# Patient Record
Sex: Male | Born: 1985
Health system: Southern US, Community
[De-identification: ages and names within clinical notes are randomized; demographics above are authoritative.]

## PROBLEM LIST (undated history)

## (undated) DIAGNOSIS — R7989 Other specified abnormal findings of blood chemistry: Secondary | ICD-10-CM

## (undated) DIAGNOSIS — I10 Essential (primary) hypertension: Secondary | ICD-10-CM

## (undated) DIAGNOSIS — J039 Acute tonsillitis, unspecified: Secondary | ICD-10-CM

## (undated) DIAGNOSIS — E785 Hyperlipidemia, unspecified: Secondary | ICD-10-CM

## (undated) HISTORY — DX: Other specified abnormal findings of blood chemistry: R79.89

## (undated) HISTORY — PX: NO PAST SURGERIES: SHX2092

## (undated) HISTORY — DX: Hyperlipidemia, unspecified: E78.5

---
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
## 2012-12-08 ENCOUNTER — Emergency Department (HOSPITAL_COMMUNITY)
Admission: EM | Admit: 2012-12-08 | Discharge: 2012-12-09 | Disposition: A | Discharge status: Home / Self Care | Payer: Self-pay | Attending: Emergency Medicine | Admitting: Emergency Medicine

## 2012-12-08 DIAGNOSIS — L0211 Cutaneous abscess of neck: Secondary | ICD-10-CM | POA: Insufficient documentation

## 2012-12-08 MED ORDER — SULFAMETHOXAZOLE-TRIMETHOPRIM 800-160 MG PO TABS: 1.0000 | ORAL_TABLET | Freq: Two times a day (BID) | ORAL | Status: DC

## 2012-12-08 MED ORDER — IBUPROFEN 800 MG PO TABS
800.0000 mg | ORAL_TABLET | Freq: Once | ORAL | Status: AC
  Administered 2012-12-08: 800 mg via ORAL
  Filled 2012-12-08: qty 1

## 2012-12-08 MED ORDER — IBUPROFEN 800 MG PO TABS: 800.0000 mg | ORAL_TABLET | Freq: Three times a day (TID) | ORAL | Status: DC

## 2012-12-08 NOTE — ED Notes (Signed)
Pt c/o abscess to R side of neck and back of head on R side. Pt states he is taking amoxicillin for this. States abscesses getting worse. Pt with no acute distress.
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
## 2012-12-09 NOTE — ED Provider Notes (Signed)
Medical screening examination/treatment/procedure(s) were performed by non-physician practitioner and as supervising physician I was immediately available for consultation/collaboration.   Janise Gora M Melody Cirrincione, DO 12/09/12 1222 

## 2012-12-18 ENCOUNTER — Emergency Department (HOSPITAL_COMMUNITY)
Admission: EM | Admit: 2012-12-18 | Discharge: 2012-12-18 | Disposition: A | Discharge status: Home / Self Care | Payer: Self-pay | Attending: Emergency Medicine | Admitting: Emergency Medicine

## 2012-12-18 ENCOUNTER — Encounter (HOSPITAL_COMMUNITY): Payer: Self-pay | Admitting: Emergency Medicine

## 2012-12-18 DIAGNOSIS — L0291 Cutaneous abscess, unspecified: Secondary | ICD-10-CM

## 2012-12-18 DIAGNOSIS — L0211 Cutaneous abscess of neck: Secondary | ICD-10-CM | POA: Insufficient documentation

## 2012-12-18 MED ORDER — SULFAMETHOXAZOLE-TMP DS 800-160 MG PO TABS
1.0000 | ORAL_TABLET | Freq: Once | ORAL | Status: AC
  Administered 2012-12-18: 1 via ORAL
  Filled 2012-12-18: qty 1

## 2012-12-18 MED ORDER — SULFAMETHOXAZOLE-TMP DS 800-160 MG PO TABS: 1.0000 | ORAL_TABLET | Freq: Two times a day (BID) | ORAL | Status: DC

## 2012-12-18 NOTE — ED Notes (Signed)
Pt was treated for 2 abscesses of the neck on 10/5, which were drained and packed. Pt was put on abx and took the full course. Pt states the abscessed areas have gone down in size, but are not completely gone. He also states he feels another abscess forming on his left cheek.

## 2012-12-18 NOTE — ED Provider Notes (Signed)
CSN: [id number]     Arrival date & time 12/18/12  1227 History   First MD Initiated Contact with Patient 12/18/12 1309     This chart was scribed for Illene Labrador, by Ladona Ridgel Day, ED scribe. This patient was seen in room WA08/WA08 and the patient's care was started at 1227.  Chief Complaint  Patient presents with  . Wound Check   The history is provided by the patient. No language interpreter was used.   HPI Comments: Daniel Kelley is a 27 y.o. male who presents to the Emergency Department complaining of x2 painful abscesses over anterior aspect of his neck which have gradually worsened since they were recently drained 5 days ago here in ED. He states finished his round of Keflex and is no longer taking antibiotics. He states it feels like there is a new abscess beginning on his anterior neck also. He denies any current drainage from any of his abscesses. He states associated pain and swelling. He denies fever, chills, difficulty breathing or swallowing, nausea, emesis, diarrhea. He has not tried any medicines or other treatments for this problem at home.   History reviewed. No pertinent past medical history. History reviewed. No pertinent past surgical history. History reviewed. No pertinent family history. History  Substance Use Topics  . Smoking status: Never Smoker   . Smokeless tobacco: Not on file  . Alcohol Use: 1.2 oz/week    2 Cans of beer per week    Review of Systems  All other systems reviewed and are negative.   A complete 10 system review of systems was obtained and all systems are negative except as noted in the HPI and PMH.   Allergies  Shrimp  Home Medications   Current Outpatient Rx  Name  Route  Sig  Dispense  Refill  . ibuprofen (ADVIL,MOTRIN) 800 MG tablet   Oral   Take 800 mg by mouth every 8 (eight) hours as needed for pain.          Triage Vitals: BP 135/69  Pulse 85  Temp(Src) 98.5 F (36.9 C) (Oral)  Resp 18  SpO2 98% Physical Exam   Nursing note and vitals reviewed. Constitutional: He is oriented to person, place, and time. He appears well-developed and well-nourished. No distress.  HENT:  Head: Normocephalic.    Eyes: Conjunctivae and EOM are normal.  Neck:    Well-healing abscess to bilateral lower mandibular areas. No fluctuance and minimal induration and approximately half a centimeter in diameter.  Cardiovascular: Normal rate.   Pulmonary/Chest: Effort normal. No stridor.  Musculoskeletal: Normal range of motion.  Neurological: He is alert and oriented to person, place, and time.  Psychiatric: He has a normal mood and affect.    ED Course  Procedures (including critical care time) DIAGNOSTIC STUDIES: Oxygen Saturation is 98% on room air, normal by my interpretation.    COORDINATION OF CARE: At 125 PM Discussed treatment plan with patient which includes bactrim, instructed him to use warm compresses at home, instructed pt to return to ED if abscesses do not improve or get worse. Patient agrees.   Labs Review Labs Reviewed - No data to display Imaging Review No results found.  EKG Interpretation   None       MDM   1. Abscess      Filed Vitals:   12/18/12 1244 12/18/12 1340  BP: 135/69 120/70  Pulse: 85 78  Temp: 98.5 F (36.9 C) 98 F (36.7 C)  TempSrc: Oral Oral  Resp:  18 12  SpO2: 98% 97%     Fines Clos is a 27 y.o. male for wound check 2 abscess in lower mandibular area. Patient also has these abscess forming to left zygomatic area. Not fluctuant, not appropriate for incision and drainage at this time. I have advised him to start Bactrim and apply warm compresses return precautions discussed for maturing of abscess into fluctuance.  Medications  sulfamethoxazole-trimethoprim (BACTRIM DS) 800-160 MG per tablet 1 tablet (1 tablet Oral Given 12/18/12 1350)    Pt is hemodynamically stable, appropriate for, and amenable to discharge at this time. Pt verbalized understanding and  agrees with care plan. All questions answered. Outpatient follow-up and specific return precautions discussed.    Discharge Medication List as of 12/18/2012  1:40 PM    START taking these medications   Details  sulfamethoxazole-trimethoprim (BACTRIM DS) 800-160 MG per tablet Take 1 tablet by mouth 2 (two) times daily., Starting 12/18/2012, Until Discontinued, Print        I personally performed the services described in this documentation, which was scribed in my presence. The recorded information has been reviewed and is accurate.  Note: Portions of this report may have been transcribed using voice recognition software. Every effort was made to ensure accuracy; however, inadvertent computerized transcription errors may be present       Wynetta Emery, PA-C 12/19/12 1636

## 2012-12-18 NOTE — ED Notes (Signed)
Pt escorted to discharge window. Pt verbalized understanding discharge instructions. In no acute distress.  

## 2012-12-18 NOTE — ED Notes (Signed)
Pt alert and oriented x4. Respirations even and unlabored, bilateral symmetrical rise and fall of chest. Skin warm and dry. In no acute distress. Denies needs.   

## 2012-12-20 NOTE — ED Provider Notes (Signed)
Medical screening examination/treatment/procedure(s) were performed by non-physician practitioner and as supervising physician I was immediately available for consultation/collaboration.    Sinia Antosh R Kesa Birky, MD 12/20/12 1134 

## 2014-05-25 ENCOUNTER — Encounter (HOSPITAL_BASED_OUTPATIENT_CLINIC_OR_DEPARTMENT_OTHER): Payer: Self-pay

## 2014-05-25 ENCOUNTER — Emergency Department (HOSPITAL_BASED_OUTPATIENT_CLINIC_OR_DEPARTMENT_OTHER)
Admission: EM | Admit: 2014-05-25 | Discharge: 2014-05-25 | Disposition: A | Discharge status: Home / Self Care | Payer: No Typology Code available for payment source | Attending: Emergency Medicine | Admitting: Emergency Medicine

## 2014-05-25 ENCOUNTER — Emergency Department (HOSPITAL_BASED_OUTPATIENT_CLINIC_OR_DEPARTMENT_OTHER): Payer: No Typology Code available for payment source

## 2014-05-25 DIAGNOSIS — S39012D Strain of muscle, fascia and tendon of lower back, subsequent encounter: Secondary | ICD-10-CM

## 2014-05-25 DIAGNOSIS — M545 Low back pain, unspecified: Secondary | ICD-10-CM

## 2014-05-25 DIAGNOSIS — M533 Sacrococcygeal disorders, not elsewhere classified: Secondary | ICD-10-CM

## 2014-05-25 MED ORDER — IBUPROFEN 800 MG PO TABS: 800.0000 mg | ORAL_TABLET | Freq: Three times a day (TID) | ORAL | Status: DC

## 2014-05-25 MED ORDER — METHOCARBAMOL 500 MG PO TABS: 1000.0000 mg | ORAL_TABLET | Freq: Four times a day (QID) | ORAL | Status: DC | PRN

## 2014-05-25 NOTE — Discharge Instructions (Signed)
Sciatica °Sciatica is pain, weakness, numbness, or tingling along the path of the sciatic nerve. The nerve starts in the lower back and runs down the back of each leg. The nerve controls the muscles in the lower leg and in the back of the knee, while also providing sensation to the back of the thigh, lower leg, and the sole of your foot. Sciatica is a symptom of another medical condition. For instance, nerve damage or certain conditions, such as a herniated disk or bone spur on the spine, pinch or put pressure on the sciatic nerve. This causes the pain, weakness, or other sensations normally associated with sciatica. Generally, sciatica only affects one side of the body. °CAUSES  °· Herniated or slipped disc. °· Degenerative disk disease. °· A pain disorder involving the narrow muscle in the buttocks (piriformis syndrome). °· Pelvic injury or fracture. °· Pregnancy. °· Tumor (rare). °SYMPTOMS  °Symptoms can vary from mild to very severe. The symptoms usually travel from the low back to the buttocks and down the back of the leg. Symptoms can include: °· Mild tingling or dull aches in the lower back, leg, or hip. °· Numbness in the back of the calf or sole of the foot. °· Burning sensations in the lower back, leg, or hip. °· Sharp pains in the lower back, leg, or hip. °· Leg weakness. °· Severe back pain inhibiting movement. °These symptoms may get worse with coughing, sneezing, laughing, or prolonged sitting or standing. Also, being overweight may worsen symptoms. °DIAGNOSIS  °Your caregiver will perform a physical exam to look for common symptoms of sciatica. He or she may ask you to do certain movements or activities that would trigger sciatic nerve pain. Other tests may be performed to find the cause of the sciatica. These may include: °· Blood tests. °· X-rays. °· Imaging tests, such as an MRI or CT scan. °TREATMENT  °Treatment is directed at the cause of the sciatic pain. Sometimes, treatment is not necessary  and the pain and discomfort goes away on its own. If treatment is needed, your caregiver may suggest: °· Over-the-counter medicines to relieve pain. °· Prescription medicines, such as anti-inflammatory medicine, muscle relaxants, or narcotics. °· Applying heat or ice to the painful area. °· Steroid injections to lessen pain, irritation, and inflammation around the nerve. °· Reducing activity during periods of pain. °· Exercising and stretching to strengthen your abdomen and improve flexibility of your spine. Your caregiver may suggest losing weight if the extra weight makes the back pain worse. °· Physical therapy. °· Surgery to eliminate what is pressing or pinching the nerve, such as a bone spur or part of a herniated disk. °HOME CARE INSTRUCTIONS  °· Only take over-the-counter or prescription medicines for pain or discomfort as directed by your caregiver. °· Apply ice to the affected area for 20 minutes, 3-4 times a day for the first 48-72 hours. Then try heat in the same way. °· Exercise, stretch, or perform your usual activities if these do not aggravate your pain. °· Attend physical therapy sessions as directed by your caregiver. °· Keep all follow-up appointments as directed by your caregiver. °· Do not wear high heels or shoes that do not provide proper support. °· Check your mattress to see if it is too soft. A firm mattress may lessen your pain and discomfort. °SEEK IMMEDIATE MEDICAL CARE IF:  °· You lose control of your bowel or bladder (incontinence). °· You have increasing weakness in the lower back, pelvis, buttocks,   or legs. °· You have redness or swelling of your back. °· You have a burning sensation when you urinate. °· You have pain that gets worse when you lie down or awakens you at night. °· Your pain is worse than you have experienced in the past. °· Your pain is lasting longer than 4 weeks. °· You are suddenly losing weight without reason. °MAKE SURE YOU: °· Understand these  instructions. °· Will watch your condition. °· Will get help right away if you are not doing well or get worse. °Document Released: 02/14/2001 Document Revised: 08/22/2011 Document Reviewed: 07/02/2011 °ExitCare® Patient Information ©2015 [employer]. This information is not intended to replace advice given to you by your health care provider. Make sure you discuss any questions you have with your health care provider. ° ° °Back Exercises °Back exercises help treat and prevent back injuries. The goal of back exercises is to increase the strength of your abdominal and back muscles and the flexibility of your back. These exercises should be started when you no longer have back pain. Back exercises include: °· Pelvic Tilt. Lie on your back with your knees bent. Tilt your pelvis until the lower part of your back is against the floor. Hold this position 5 to 10 sec and repeat 5 to 10 times. °· Knee to Chest. Pull first 1 knee up against your chest and hold for 20 to 30 seconds, repeat this with the other knee, and then both knees. This may be done with the other leg straight or bent, whichever feels better. °· Sit-Ups or Curl-Ups. Bend your knees 90 degrees. Start with tilting your pelvis, and do a partial, slow sit-up, lifting your trunk only 30 to 45 degrees off the floor. Take at least 2 to 3 seconds for each sit-up. Do not do sit-ups with your knees out straight. If partial sit-ups are difficult, simply do the above but with only tightening your abdominal muscles and holding it as directed. °· Hip-Lift. Lie on your back with your knees flexed 90 degrees. Push down with your feet and shoulders as you raise your hips a couple inches off the floor; hold for 10 seconds, repeat 5 to 10 times. °· Back arches. Lie on your stomach, propping yourself up on bent elbows. Slowly press on your hands, causing an arch in your low back. Repeat 3 to 5 times. Any initial stiffness and discomfort should lessen with repetition over  time. °· Shoulder-Lifts. Lie face down with arms beside your body. Keep hips and torso pressed to floor as you slowly lift your head and shoulders off the floor. °Do not overdo your exercises, especially in the beginning. Exercises may cause you some mild back discomfort which lasts for a few minutes; however, if the pain is more severe, or lasts for more than 15 minutes, do not continue exercises until you see your caregiver. Improvement with exercise therapy for back problems is slow.  °See your caregivers for assistance with developing a proper back exercise program. °Document Released: 03/30/2004 Document Revised: 05/15/2011 Document Reviewed: 12/22/2010 °ExitCare® Patient Information ©2015 [employer]. This information is not intended to replace advice given to you by your health care provider. Make sure you discuss any questions you have with your health care provider. ° ° °Emergency Department Resource Guide °1) Find a Doctor and Pay Out of Pocket °Although you won't have to find out who is covered by your insurance plan, it is a good idea to ask around and get recommendations. You   will then need to call the office and see if the doctor you have chosen will accept you as a new patient and what types of options they offer for patients who are self-pay. Some doctors offer discounts or will set up payment plans for their patients who do not have insurance, but you will need to ask so you aren't surprised when you get to your appointment. ° °2) Contact Your Local Health Department °Not all health departments have doctors that can see patients for sick visits, but many do, so it is worth a call to see if yours does. If you don't know where your local health department is, you can check in your phone book. The CDC also has a tool to help you locate your state's health department, and many state websites also have listings of all of their local health departments. ° °3) Find a Walk-in Clinic °If your illness is  not likely to be very severe or complicated, you may want to try a walk in clinic. These are popping up all over the country in pharmacies, drugstores, and shopping centers. They're usually staffed by nurse practitioners or physician assistants that have been trained to treat common illnesses and complaints. They're usually fairly quick and inexpensive. However, if you have serious medical issues or chronic medical problems, these are probably not your best option. ° °No Primary Care Doctor: °- Call [facility] at  832-8000 - they can help you locate a primary care doctor that  accepts your insurance, provides certain services, etc. °- Physician Referral Service- 1-800-533-3463 ° °Chronic Pain Problems: °Organization         Address  Phone   Notes  °[facility]  (336) 297-2271 Patients need to be referred by their primary care doctor.  ° °Medication Assistance: °Organization         Address  Phone   Notes  °[place] [facility] [address] °[place], [place] [address] (336) 641-8030 --Must be a resident of [place] °-- Must have NO insurance coverage whatsoever (no Medicaid/ Medicare, etc.) °-- The pt. MUST have a primary care doctor that directs their care regularly and follows them in the community °  °[facility]  (866) 331-1348   °[employer]  (888) 892-1162   ° °Agencies that provide inexpensive medical care: °Organization         Address  Phone   Notes  °[facility]  (336) 832-8035   °[facility]    (336) 832-7272   °[facility] [address] °[place], [place] [address] (336) 832-4777   °[facility] [address], °[place] (336) 271-4999   °[facility]    (336) 373-0678   °[facility]    (336) 272-1050   °[facility] ° [address], [place] Phone:  (336) 832-4444, Fax:  (336) 832-4440 Hours of Operation:  9 am - 6  pm, M-F.  Also accepts Medicaid/Medicare and self-pay.  °[facility] ° [address], [place] Phone: (336) 832-3150, Fax: (336) [phone]. Hours of Operation:  8:30 am - 5:30 pm, M-F.  Also accepts Medicaid and self-pay.  °[facility] [place] [address], [place] Phone: (336) 878-6027   °[facility] [address], [place],  (336)723-1848, Ext. 123 Mondays & Thursdays: 7-9 AM.  First 15 patients are seen on a first come, first   serve basis. °  ° °Medicaid-accepting [place] Providers: ° °Organization         Address  Phone   Notes  °[facility] [address], [place] (336) 641-2100 Also accepts self-pay patients.  °[facility] [address], [place] ° (336) 856-9996   °[facility] [address], [place] (336) 288-8857   °[facility] [address] [place] [address], [place] (336) 299-7000   °Veita Bland [address], [place]  ° (336) 373-1557 Only accepts [place] Access Medicaid patients after they have their name applied to their card.  ° °Self-Pay (no insurance) in [place]: ° °Organization         Address  Phone   Notes  °Sickle Cell Patients, [facility] [address], [place] (336) 832-1970   °[facility] [address], [place] (336) 832-4400   °[facility] [place] ° [address], [place] (336) 992-4800   °[facility]/Dr. Osei-Bonsu ° [address] [place] [address], [place] or [address], [place] (336) [phone] Phone number for both [place] and [place] locations is the same.  °[facility] [address], [place] (336) 299-0000   °[facility] [address] [place] [address], [place] or [address] (336) 852-7530 °(336) 878-2260   °[facility] [address], [place] (336) 350-1642, phone; (336) 294-5005, fax Sees patients 1st and 3rd Saturday of every month.  Must not qualify for public or private insurance (i.e. Medicaid, Medicare, [place] Health Choice, Veterans' Benefits) • Household income should be no more than 200% of the poverty level •The clinic cannot treat you if you are pregnant or think you are pregnant • Sexually transmitted diseases are not treated at the clinic.  ° ° °Dental Care: °Organization         Address  Phone  Notes  °[facility] [address], [place] (336) 641-6152 Accepts children up to age 21 who are enrolled in Medicaid or [place] Health Choice; pregnant women with a Medicaid card; and children who have applied for Medicaid or [place] Health Choice, but were declined, whose parents can pay a reduced fee at time of service.  °[facility] [place]  [address], [place] (336) 641-7733 Accepts children up to age 21 who are enrolled in Medicaid or [place] Health Choice; pregnant women with a Medicaid card; and children who have applied for Medicaid or [place] Health Choice, but were declined, whose parents can pay a reduced fee at time of service.  °[facility] ° [address], [place] (336) 641-4533 Patients are seen by appointment only. Walk-ins are not accepted. [facility] will see patients 18 years of age and older. °Monday - Tuesday (8am-5pm) °Most Wednesdays (8:30-5pm) °$30 per visit, cash only  °[facility] ° [address], [place] (336) 641-4533 Patients are seen by appointment only. Walk-ins are not accepted. [facility] will see patients 18 years of age and older. °One Wednesday Evening (Monthly: Volunteer Based).  $30 per visit, cash only  °[facility]  (919) [phone] for adults; Children under age 4, call Graduate Pediatric Dentistry at ([phone]  [phone]. Children aged 4-14, please call (  [phone]) [phone] to request a pediatric application. ° Dental services are provided in all areas of dental care including fillings, crowns and bridges, complete and partial dentures, implants, gum treatment, root canals, and extractions. Preventive care is also provided. Treatment is provided to both adults and children. °Patients are selected via a lottery and there is often a waiting list. °  °[facility] [address], °[place] ° (336) 763-8833 [email or web address] °  °[facility] [address], [place], [place] (336)723-1848, Ext. 123 Second and Fourth Thursday of each month, opens at 6:30 AM; Clinic ends at 9 AM.  Patients are seen on a first-come first-served basis, and a limited number are seen during each clinic.  ° °[facility] ° [address], [place], [place] (336) 723-7904   Eligibility Requirements °You must have lived in [place], or [place] counties for at least the last three months. °  You cannot be eligible for state or federal sponsored healthcare insurance, including Veterans Administration, Medicaid, or Medicare. °  You generally cannot be eligible for healthcare insurance through your employer.  °  How to apply: °Eligibility screenings are held every Tuesday and Wednesday afternoon from 1:00 pm until 4:00 pm. You do not need an appointment for the interview!  °[facility] [address], [place], [place] 336-631-2330   °[facility]  336-342-8273   °[facility]  336-703-3100   °[facility]  336-570-6415   ° °Behavioral Health Resources in the Community: °Intensive Outpatient Programs °Organization         Address  Phone  Notes  °[facility] [address], [place], [place] 336-878-6098   °[facility] [address], [place], [place] 336-832-9800   °[facility]  [address], [place], [place] ° 336-882-2125   °[facility] [address],  °[place], [place] 1-800-853-5163 or 336-641-4981   °Substance Abuse Resources °Organization         Address  Phone  Notes  °[facility]  336-882-2125   °[facility]  336-784-9470   °[facility]  336-285-9073   °[facility]  336-845-3988   °Residential & Outpatient Substance Abuse Program  1-800-659-3381   °Psychological Services °Organization         Address  Phone  Notes  °[facility]  336- 832-9600   °[facility]  336- 378-7881   °[facility] [address], [place] 1-800-853-5163 or 336-641-4981   ° °Mobile Crisis Teams °Organization         Address  Phone  Notes  °[facility], Mobile Crisis Care Unit  1-877-626-1772   °Assertive °[facility] ° [address] [place], [place] 336-834-9664   °Sharon DeEsch [address] °[place] 336-554-5454   ° °Self-Help/Support Groups °Organization         Address  Phone             Notes  °[facility] - variety of support groups  336- 373-1402 Call for more information  °Narcotics Anonymous (NA), [facility] [address], °[place]  2 meetings at this location  ° °Residential Treatment Programs °Organization         Address  Phone  Notes  °[facility] [address],    °[place]  1-866-801-8205   °[facility] °   [address], [place], [place] 704-293-8524   °[facility] [address], [place] 336-845-3988 Admissions: 8am-3pm M-F  °[facility] [address]    °[place], [place] 336-841-1104   °[facility] [address], [place], [place] 336-379-7146   °[facility] [address]  °[place], [place] 336-285-9073   °[facility] - Intensive Outpatient [address], [place], [place]  336-852-3033   °[facility] [address]  °[place], [place] 1-877-615-2722 or 336-784-9470   °[facility] [address] [place], [place] 336-227-7417 Accepts Medicaid  °[facility] [address]  °[place] 1-800-659-3381 Substance Abuse/Addiction Treatment  ° °[place] Behavioral Health Resources °Organization         Address  Phone  Notes  °[facility]  (888) 581-9988   °Julie Brannon, PhD [address] [place], [place]   (336) 349-5553 or (336) 951-0000   °[facility]   [address] °[place], [place] (336) 349-4454   °[facility] [address], [place], [place] (336) 342-8316 Insurance/Medicaid/sponsorship through [facility]  °[facility] [address]                                    [place], [place] (336) 342-8316 Therapy/tele-psych/case  °[facility] [address]  ° [place], [place] (336) 349-2233    °Dr. Arfeen  (336) 349-4544   °[facility] [place]  [employer] [place] [facility] 1) [address], [place] °2) [address], [place] °3)  [address], [place] (336) 349-3220 °(336) 342-7768 ° °(336) 342-8140   °[place] [facility] (336) 342-1394 or (336) 342-3537 (After Hours)    ° ° ° °
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
## 2014-05-25 NOTE — ED Notes (Signed)
PA at bedside.
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
## 2014-10-03 ENCOUNTER — Emergency Department (HOSPITAL_BASED_OUTPATIENT_CLINIC_OR_DEPARTMENT_OTHER)
Admission: EM | Admit: 2014-10-03 | Discharge: 2014-10-03 | Disposition: A | Discharge status: Home / Self Care | Payer: 59 | Attending: Emergency Medicine | Admitting: Emergency Medicine

## 2014-10-03 ENCOUNTER — Encounter (HOSPITAL_BASED_OUTPATIENT_CLINIC_OR_DEPARTMENT_OTHER): Payer: Self-pay | Admitting: *Deleted

## 2014-10-03 DIAGNOSIS — H6091 Unspecified otitis externa, right ear: Secondary | ICD-10-CM | POA: Diagnosis not present

## 2014-10-03 DIAGNOSIS — H9201 Otalgia, right ear: Secondary | ICD-10-CM | POA: Diagnosis present

## 2014-10-03 MED ORDER — OXYCODONE-ACETAMINOPHEN 5-325 MG PO TABS
1.0000 | ORAL_TABLET | Freq: Once | ORAL | Status: AC
  Administered 2014-10-03: 1 via ORAL
  Filled 2014-10-03: qty 1

## 2014-10-03 MED ORDER — NEOMYCIN-POLYMYXIN-HC 3.5-10000-1 OT SUSP: 3.0000 [drp] | Freq: Three times a day (TID) | OTIC | Status: DC

## 2014-10-03 MED ORDER — CEPHALEXIN 250 MG PO CAPS
500.0000 mg | ORAL_CAPSULE | Freq: Once | ORAL | Status: AC
Start: 2014-10-03 — End: 2014-10-03
  Administered 2014-10-03: 500 mg via ORAL
  Filled 2014-10-03: qty 2

## 2014-10-03 MED ORDER — NEOMYCIN-COLIST-HC-THONZONIUM 3.3-3-10-0.5 MG/ML OT SUSP
3.0000 [drp] | Freq: Once | OTIC | Status: AC
  Administered 2014-10-03: 3 [drp] via OTIC
  Filled 2014-10-03: qty 5

## 2014-10-03 MED ORDER — OXYCODONE-ACETAMINOPHEN 5-325 MG PO TABS: 1.0000 | ORAL_TABLET | ORAL | Status: DC | PRN

## 2014-10-03 MED ORDER — CEPHALEXIN 500 MG PO CAPS: 500.0000 mg | ORAL_CAPSULE | Freq: Four times a day (QID) | ORAL | Status: DC

## 2014-10-03 NOTE — ED Notes (Signed)
Pt reports that he thought that he had a 'black head' in his ear.  States that he attempted to 'pop' it and is now having ear pain in his right ear.

## 2014-10-03 NOTE — Discharge Instructions (Signed)
Otitis Externa Otitis externa is a bacterial or fungal infection of the outer ear canal. This is the area from the eardrum to the outside of the ear. Otitis externa is sometimes called "swimmer's ear." CAUSES  Possible causes of infection include:  Swimming in dirty water.  Moisture remaining in the ear after swimming or bathing.  Mild injury (trauma) to the ear.  Objects stuck in the ear (foreign body).  Cuts or scrapes (abrasions) on the outside of the ear. SIGNS AND SYMPTOMS  The first symptom of infection is often itching in the ear canal. Later signs and symptoms may include swelling and redness of the ear canal, ear pain, and yellowish-white fluid (pus) coming from the ear. The ear pain may be worse when pulling on the earlobe. DIAGNOSIS  Your health care provider will perform a physical exam. A sample of fluid may be taken from the ear and examined for bacteria or fungi. TREATMENT  Antibiotic ear drops are often given for 10 to 14 days. Treatment may also include pain medicine or corticosteroids to reduce itching and swelling. HOME CARE INSTRUCTIONS   Apply antibiotic ear drops to the ear canal as prescribed by your health care provider.  Take medicines only as directed by your health care provider.  If you have diabetes, follow any additional treatment instructions from your health care provider.  Keep all follow-up visits as directed by your health care provider. PREVENTION   Keep your ear dry. Use the corner of a towel to absorb water out of the ear canal after swimming or bathing.  Avoid scratching or putting objects inside your ear. This can damage the ear canal or remove the protective wax that lines the canal. This makes it easier for bacteria and fungi to grow.  Avoid swimming in lakes, polluted water, or poorly chlorinated pools.  You may use ear drops made of rubbing alcohol and vinegar after swimming. Combine equal parts of white vinegar and alcohol in a bottle.  Put 3 or 4 drops into each ear after swimming. SEEK MEDICAL CARE IF:   You have a fever.  Your ear is still red, swollen, painful, or draining pus after 3 days.  Your redness, swelling, or pain gets worse.  You have a severe headache.  You have redness, swelling, pain, or tenderness in the area behind your ear. MAKE SURE YOU:   Understand these instructions.  Will watch your condition.  Will get help right away if you are not doing well or get worse. Document Released: 02/20/2005 Document Revised: 07/07/2013 Document Reviewed: 03/09/2011 [facility] Patient Information 2015 [place], [place]. This information is not intended to replace advice given to you by your health care provider. Make sure you discuss any questions you have with your health care provider.  Cephalexin tablets or capsules What is this medicine? CEPHALEXIN (sef a LEX in) is a cephalosporin antibiotic. It is used to treat certain kinds of bacterial infections It will not work for colds, flu, or other viral infections. This medicine may be used for other purposes; ask your health care provider or pharmacist if you have questions. COMMON BRAND NAME(S): Biocef, Keflex, Keftab What should I tell my health care provider before I take this medicine? They need to know if you have any of these conditions: -kidney disease -stomach or intestine problems, especially colitis -an unusual or allergic reaction to cephalexin, other cephalosporins, penicillins, other antibiotics, medicines, foods, dyes or preservatives -pregnant or trying to get pregnant -breast-feeding How should I use this medicine?  Take this medicine by mouth with a full glass of water. Follow the directions on the prescription label. This medicine can be taken with or without food. Take your medicine at regular intervals. Do not take your medicine more often than directed. Take all of your medicine as directed even if you think you are better. Do not skip doses or  stop your medicine early. Talk to your pediatrician regarding the use of this medicine in children. While this drug may be prescribed for selected conditions, precautions do apply. Overdosage: If you think you have taken too much of this medicine contact a poison control center or emergency room at once. NOTE: This medicine is only for you. Do not share this medicine with others. What if I miss a dose? If you miss a dose, take it as soon as you can. If it is almost time for your next dose, take only that dose. Do not take double or extra doses. There should be at least 4 to 6 hours between doses. What may interact with this medicine? -probenecid -some other antibiotics This list may not describe all possible interactions. Give your health care provider a list of all the medicines, herbs, non-prescription drugs, or dietary supplements you use. Also tell them if you smoke, drink alcohol, or use illegal drugs. Some items may interact with your medicine. What should I watch for while using this medicine? Tell your doctor or health care professional if your symptoms do not begin to improve in a few days. Do not treat diarrhea with over the counter products. Contact your doctor if you have diarrhea that lasts more than 2 days or if it is severe and watery. If you have diabetes, you may get a false-positive result for sugar in your urine. Check with your doctor or health care professional. What side effects may I notice from receiving this medicine? Side effects that you should report to your doctor or health care professional as soon as possible: -allergic reactions like skin rash, itching or hives, swelling of the face, lips, or tongue -breathing problems -pain or trouble passing urine -redness, blistering, peeling or loosening of the skin, including inside the mouth -severe or watery diarrhea -unusually weak or tired -yellowing of the eyes, skin Side effects that usually do not require medical  attention (report to your doctor or health care professional if they continue or are bothersome): -gas or heartburn -genital or anal irritation -headache -joint or muscle pain -nausea, vomiting This list may not describe all possible side effects. Call your doctor for medical advice about side effects. You may report side effects to FDA at 1-800-FDA-1088. Where should I keep my medicine? Keep out of the reach of children. Store at room temperature between 59 and 86 degrees F (15 and 30 degrees C). Throw away any unused medicine after the expiration date. NOTE: This sheet is a summary. It may not cover all possible information. If you have questions about this medicine, talk to your doctor, pharmacist, or health care provider.  2015, Elsevier/Gold Standard. (2007-05-27 17:09:13)  Hydrocortisone; Neomycin; Polymyxin B ear suspension What is this medicine? HYDROCORTISONE; NEOMYCIN; and POLYMYXIN B (hye droe KOR ti sone; nee oh MYE sin; pol i MIX in B) is used to treat ear infections. This medicine may be used for other purposes; ask your health care provider or pharmacist if you have questions. COMMON BRAND NAME(S): AK-Spore HC, AK-Spore HC Otic, Antibiotic Otic, Aural, Cortisporin, Cortomycin, Duomycin-HC, Oti-Sone, Oticin HC, Otimar, Pediotic, Uad What should I  tell my health care provider before I take this medicine? They need to know if you have any of these conditions: -any other active infections -chronic ear infections or fluid in the ear -perforated ear drum -an unusual or allergic reaction to hydrocortisone, neomycin, polymyxin B, sulfites, other medicines, foods, dyes, or preservatives -pregnant or trying to get pregnant -breast-feeding How should I use this medicine? This medicine is only for use in the ears. Wash your hands with soap and water. Clean your ear of any fluid that can be easily removed. Do not insert any object or swab into the ear canal. Gently warm the bottle by  holding it in the hand for 1 to 2 minutes. Lie down on your side with the infected ear up. Try not to touch the tip of the dropper to your ear, fingertips, or other surface. Shake the bottle immediately before using. Squeeze the bottle gently to put the prescribed number of drops in the ear canal. Stay in this position for 30 to 60 seconds to help the drops soak into the ear. Repeat the steps for the other ear if both ears are infected. Do not use your medicine more often than directed. Finish the full course of medicine prescribed by your doctor or health care professional even if you think your condition is better. Talk to your pediatrician regarding the use of this medicine in children. While this drug may be prescribed for selected conditions, precautions do apply. Overdosage: If you think you have taken too much of this medicine contact a poison control center or emergency room at once. NOTE: This medicine is only for you. Do not share this medicine with others. What if I miss a dose? If you miss a dose, use it as soon as you can. If it is almost time for your next dose, use only that dose. Do not take double or extra doses. What may interact with this medicine? Interactions are not expected. Do not use other ear products without talking to your doctor or health care professional. This list may not describe all possible interactions. Give your health care provider a list of all the medicines, herbs, non-prescription drugs, or dietary supplements you use. Also tell them if you smoke, drink alcohol, or use illegal drugs. Some items may interact with your medicine. What should I watch for while using this medicine? Tell your doctor or health care professional if your ear infection does not get better in a few days. Do not use longer than 10 days unless instructed by your doctor or health care professional. If rash or allergic reaction occurs, stop the product immediately and contact your physician. It  is important that you keep the infected ear(s) clean and dry. When bathing, try not to get the infected ear(s) wet. Do not go swimming unless your doctor or health care professional has told you otherwise. To prevent the spread of infection, do not share ear products, or share towels and washcloths with anyone else. What side effects may I notice from receiving this medicine? Side effects that you should report to your doctor or health care professional as soon as possible: -rash -red, itchy, dry scaly skin at the affected site -worsening ear pain Side effects that usually do not require medical attention (report to your doctor or health care professional if they continue or are bothersome): -abnormal sensation in the ear -burning or stinging while putting the drops in the ear This list may not describe all possible side effects. Call your  doctor for medical advice about side effects. You may report side effects to FDA at 1-800-FDA-1088. Where should I keep my medicine? Keep out of the reach of children. Store at room temperature between 15 and 25 degrees C (59 and 77 degrees F). Do not freeze. Throw away any unused medicine after the expiration date. NOTE: This sheet is a summary. It may not cover all possible information. If you have questions about this medicine, talk to your doctor, pharmacist, or health care provider.  2015, Elsevier/Gold Standard. (2007-07-05 15:49:00)  Acetaminophen; Oxycodone tablets What is this medicine? ACETAMINOPHEN; OXYCODONE (a set a MEE noe fen; ox i KOE done) is a pain reliever. It is used to treat mild to moderate pain. This medicine may be used for other purposes; ask your health care provider or pharmacist if you have questions. COMMON BRAND NAME(S): Endocet, Magnacet, Narvox, Percocet, Perloxx, Primalev, Primlev, Roxicet, Xolox What should I tell my health care provider before I take this medicine? They need to know if you have any of these  conditions: -brain tumor -Crohn's disease, inflammatory bowel disease, or ulcerative colitis -drug abuse or addiction -head injury -heart or circulation problems -if you often drink alcohol -kidney disease or problems going to the bathroom -liver disease -lung disease, asthma, or breathing problems -an unusual or allergic reaction to acetaminophen, oxycodone, other opioid analgesics, other medicines, foods, dyes, or preservatives -pregnant or trying to get pregnant -breast-feeding How should I use this medicine? Take this medicine by mouth with a full glass of water. Follow the directions on the prescription label. Take your medicine at regular intervals. Do not take your medicine more often than directed. Talk to your pediatrician regarding the use of this medicine in children. Special care may be needed. Patients over 81 years old may have a stronger reaction and need a smaller dose. Overdosage: If you think you have taken too much of this medicine contact a poison control center or emergency room at once. NOTE: This medicine is only for you. Do not share this medicine with others. What if I miss a dose? If you miss a dose, take it as soon as you can. If it is almost time for your next dose, take only that dose. Do not take double or extra doses. What may interact with this medicine? -alcohol -antihistamines -barbiturates like amobarbital, butalbital, butabarbital, methohexital, pentobarbital, phenobarbital, thiopental, and secobarbital -benztropine -drugs for bladder problems like solifenacin, trospium, oxybutynin, tolterodine, hyoscyamine, and methscopolamine -drugs for breathing problems like ipratropium and tiotropium -drugs for certain stomach or intestine problems like propantheline, homatropine methylbromide, glycopyrrolate, atropine, belladonna, and dicyclomine -general anesthetics like etomidate, ketamine, nitrous oxide, propofol, desflurane, enflurane, halothane, isoflurane,  and sevoflurane -medicines for depression, anxiety, or psychotic disturbances -medicines for sleep -muscle relaxants -naltrexone -narcotic medicines (opiates) for pain -phenothiazines like perphenazine, thioridazine, chlorpromazine, mesoridazine, fluphenazine, prochlorperazine, promazine, and trifluoperazine -scopolamine -tramadol -trihexyphenidyl This list may not describe all possible interactions. Give your health care provider a list of all the medicines, herbs, non-prescription drugs, or dietary supplements you use. Also tell them if you smoke, drink alcohol, or use illegal drugs. Some items may interact with your medicine. What should I watch for while using this medicine? Tell your doctor or health care professional if your pain does not go away, if it gets worse, or if you have new or a different type of pain. You may develop tolerance to the medicine. Tolerance means that you will need a higher dose of the medication for pain relief. Tolerance  is normal and is expected if you take this medicine for a long time. Do not suddenly stop taking your medicine because you may develop a severe reaction. Your body becomes used to the medicine. This does NOT mean you are addicted. Addiction is a behavior related to getting and using a drug for a non-medical reason. If you have pain, you have a medical reason to take pain medicine. Your doctor will tell you how much medicine to take. If your doctor wants you to stop the medicine, the dose will be slowly lowered over time to avoid any side effects. You may get drowsy or dizzy. Do not drive, use machinery, or do anything that needs mental alertness until you know how this medicine affects you. Do not stand or sit up quickly, especially if you are an older patient. This reduces the risk of dizzy or fainting spells. Alcohol may interfere with the effect of this medicine. Avoid alcoholic drinks. There are different types of narcotic medicines (opiates) for  pain. If you take more than one type at the same time, you may have more side effects. Give your health care provider a list of all medicines you use. Your doctor will tell you how much medicine to take. Do not take more medicine than directed. Call emergency for help if you have problems breathing. The medicine will cause constipation. Try to have a bowel movement at least every 2 to 3 days. If you do not have a bowel movement for 3 days, call your doctor or health care professional. Do not take Tylenol (acetaminophen) or medicines that have acetaminophen with this medicine. Too much acetaminophen can be very dangerous. Many nonprescription medicines contain acetaminophen. Always read the labels carefully to avoid taking more acetaminophen. What side effects may I notice from receiving this medicine? Side effects that you should report to your doctor or health care professional as soon as possible: -allergic reactions like skin rash, itching or hives, swelling of the face, lips, or tongue -breathing difficulties, wheezing -confusion -light headedness or fainting spells -severe stomach pain -unusually weak or tired -yellowing of the skin or the whites of the eyes Side effects that usually do not require medical attention (report to your doctor or health care professional if they continue or are bothersome): -dizziness -drowsiness -nausea -vomiting This list may not describe all possible side effects. Call your doctor for medical advice about side effects. You may report side effects to FDA at 1-800-FDA-1088. Where should I keep my medicine? Keep out of the reach of children. This medicine can be abused. Keep your medicine in a safe place to protect it from theft. Do not share this medicine with anyone. Selling or giving away this medicine is dangerous and against the law. Store at room temperature between 20 and 25 degrees C (68 and 77 degrees F). Keep container tightly closed. Protect from  light. This medicine may cause accidental overdose and death if it is taken by other adults, children, or pets. Flush any unused medicine down the toilet to reduce the chance of harm. Do not use the medicine after the expiration date. NOTE: This sheet is a summary. It may not cover all possible information. If you have questions about this medicine, talk to your doctor, pharmacist, or health care provider.  2015, Elsevier/Gold Standard. (2012-10-14 13:17:35)

## 2014-10-03 NOTE — ED Provider Notes (Signed)
CSN: [id number]     Arrival date & time 10/03/14  0127 History   First MD Initiated Contact with Patient 10/03/14 0244     Chief Complaint  Patient presents with  . Otalgia     (Consider location/radiation/quality/duration/timing/severity/associated sxs/prior Treatment) Patient is a 29 y.o. male presenting with ear pain. The history is provided by the patient.  Otalgia He started having pain and drainage from his right ear this evening. He rates pain at 8/10. He is not having any difficulty hearing. He apparently had tried to pop a blackhead in the ear prior to onset of pain.  History reviewed. No pertinent past medical history. History reviewed. No pertinent past surgical history. History reviewed. No pertinent family history. History  Substance Use Topics  . Smoking status: Never Smoker   . Smokeless tobacco: Not on file  . Alcohol Use: Yes    Review of Systems  HENT: Positive for ear pain.   All other systems reviewed and are negative.     Allergies  Shrimp  Home Medications   Prior to Admission medications   Not on File   BP 148/80 mmHg  Pulse 92  Temp(Src) 98.1 F (36.7 C) (Oral)  Resp 18  Ht  (1.676 m)  Wt 214 lb (97.07 kg)  BMI 34.56 kg/m2  SpO2 98% Physical Exam  Nursing note and vitals reviewed.  29 year old male, resting comfortably and in no acute distress. Vital signs are significant for hypertension. Oxygen saturation is 98%, which is normal. Head is normocephalic and atraumatic. PERRLA, EOMI. Oropharynx is clear. There is slight swelling around the tragus of the right here and pain is elicited when tension is applied to helix and tragus of the right here. The tympanic membrane appears normal. Neck is nontender and supple without adenopathy or JVD. Back is nontender and there is no CVA tenderness. Lungs are clear without rales, wheezes, or rhonchi. Chest is nontender. Heart has regular rate and rhythm without murmur. Abdomen is soft, flat,  nontender without masses or hepatosplenomegaly and peristalsis is normoactive. Extremities have no cyanosis or edema, full range of motion is present. Skin is warm and dry without rash. Neurologic: Mental status is normal, cranial nerves are intact, there are no motor or sensory deficits.  ED Course  Procedures (including critical care time)  MDM   Final diagnoses:  Otitis externa, right    Otitis externa. He is discharged with prescriptions for cephalexin, neomycin-polymyxin-hydrocortisone suspension, and oxycodone-acetaminophen. He is referred to ENT for follow-up if he does not have clinical response over the next several days.    Dione Booze, MD 10/03/14 [phone]

## 2014-10-08 ENCOUNTER — Encounter: Payer: Self-pay | Admitting: Emergency Medicine

## 2014-10-08 ENCOUNTER — Ambulatory Visit
Admission: EM | Admit: 2014-10-08 | Discharge: 2014-10-08 | Disposition: A | Discharge status: Home / Self Care | Payer: Self-pay | Attending: Family Medicine | Admitting: Family Medicine

## 2014-10-08 DIAGNOSIS — Z021 Encounter for pre-employment examination: Secondary | ICD-10-CM

## 2014-10-08 DIAGNOSIS — Z024 Encounter for examination for driving license: Secondary | ICD-10-CM

## 2014-10-08 LAB — DEPT OF TRANSP DIPSTICK, URINE (ARMC ONLY)
GLUCOSE, UA: NEGATIVE mg/dL
HGB URINE DIPSTICK: NEGATIVE
PROTEIN: NEGATIVE mg/dL
Specific Gravity, Urine: 1.01 (ref 1.005–1.030)

## 2014-10-08 NOTE — ED Provider Notes (Signed)
CSN: [id number]     Arrival date & time 10/08/14  1002 History   First MD Initiated Contact with Patient 10/08/14 1058     Chief Complaint  Patient presents with  . DOT Physical    (Consider location/radiation/quality/duration/timing/severity/associated sxs/prior Treatment) The history is provided by the patient. No language interpreter was used.    History reviewed. No pertinent past medical history. History reviewed. No pertinent past surgical history. History reviewed. No pertinent family history. History  Substance Use Topics  . Smoking status: Light Tobacco Smoker  . Smokeless tobacco: Not on file  . Alcohol Use: Yes   . DOT exam done please see form Review of Systems  All other systems reviewed and are negative.   Allergies  Shrimp  Home Medications   Prior to Admission medications   Medication Sig Start Date End Date Taking? Authorizing Provider  cephALEXin (KEFLEX) 500 MG capsule Take 1 capsule (500 mg total) by mouth 4 (four) times daily. 10/03/14   Dione Booze, MD  neomycin-polymyxin-hydrocortisone (CORTISPORIN) 3.5-10000-1 otic suspension Place 3 drops into both ears 3 (three) times daily. X 7 days 10/03/14   Dione Booze, MD  oxyCODONE-acetaminophen (PERCOCET) 5-325 MG per tablet Take 1 tablet by mouth every 4 (four) hours as needed for moderate pain. 10/03/14   Dione Booze, MD   BP 131/85 mmHg  Pulse 77  Temp(Src) 97.3 F (36.3 C)  Resp 16  Ht  (1.702 m)  Wt 221 lb (100.245 kg)  BMI 34.61 kg/m2  SpO2 99% Physical Exam  Neck: Normal range of motion. Thyromegaly present.  Pulmonary/Chest: Breath sounds normal.  Abdominal: Soft. He exhibits no distension. There is no tenderness.  Genitourinary: Penis normal.  Musculoskeletal: Normal range of motion.  Neurological: He is alert.  Vitals reviewed. Patien here for a DOT exam  ED Course  Procedures (including critical care time) Labs Review Labs Reviewed  DEPT OF TRANSP DIPSTICK, URINE([facility]  ONLY)    Imaging Review No results found.   MDM   1. Encounter for commercial driver medical examination (CDME)        Hassan Rowan, MD 10/08/14 1335

## 2014-10-08 NOTE — ED Notes (Signed)
DOT Physical

## 2015-09-29 ENCOUNTER — Other Ambulatory Visit: Payer: Self-pay | Admitting: Otolaryngology

## 2015-09-29 ENCOUNTER — Encounter (HOSPITAL_BASED_OUTPATIENT_CLINIC_OR_DEPARTMENT_OTHER): Payer: Self-pay | Admitting: *Deleted

## 2015-09-29 NOTE — H&P (Signed)
PREOPERATIVE H&P  Chief Complaint: large tonsils  HPI: Daniel Kelley is a 30 y.o. male who presents for evaluation of large tonsils and frequent sore throats. He does snore at night . On exam he has large 3+ symmetric tonsils. He's taken to the OR for tonsillectomy.  No past medical history on file. No past surgical history on file. Social History   Social History  . Marital status: Single    Spouse name: N/A  . Number of children: N/A  . Years of education: N/A   Social History Main Topics  . Smoking status: Light Tobacco Smoker  . Smokeless tobacco: Not on file  . Alcohol use Yes  . Drug use: No  . Sexual activity: Not on file   Other Topics Concern  . Not on file   Social History Narrative  . No narrative on file   No family history on file. Allergies  Allergen Reactions  . Shrimp [Shellfish Allergy] Hives, Itching, Swelling and Rash    Not anaphalaxis   Prior to Admission medications   Medication Sig Start Date End Date Taking? Authorizing Provider  cephALEXin (KEFLEX) 500 MG capsule Take 1 capsule (500 mg total) by mouth 4 (four) times daily. 10/03/14   Dione Booze, MD  neomycin-polymyxin-hydrocortisone (CORTISPORIN) 3.5-10000-1 otic suspension Place 3 drops into both ears 3 (three) times daily. X 7 days 10/03/14   Dione Booze, MD  oxyCODONE-acetaminophen (PERCOCET) 5-325 MG per tablet Take 1 tablet by mouth every 4 (four) hours as needed for moderate pain. 10/03/14   Dione Booze, MD     Positive ROS: per HPI  All other systems have been reviewed and were otherwise negative with the exception of those mentioned in the HPI and as above.  Physical Exam: There were no vitals filed for this visit.  General: Alert, no acute distress Oral: Normal oral mucosa, 3+ symmetric appearing tonsils Nasal: Clear nasal passages, moderate size turbinates Neck: No palpable adenopathy or thyroid nodules Ear: Ear canal is clear with normal appearing TMs Cardiovascular: Regular rate  and rhythm, no murmur.  Respiratory: Clear to auscultation Neurologic: Alert and oriented x 3   Assessment/Plan: chronic tonsilitis Plan for Procedure(s): TONSILLECTOMY   Dillard Cannon, MD 09/29/2015 1:00 PM

## 2015-10-01 ENCOUNTER — Ambulatory Visit (HOSPITAL_BASED_OUTPATIENT_CLINIC_OR_DEPARTMENT_OTHER): Payer: PRIVATE HEALTH INSURANCE | Admitting: Anesthesiology

## 2015-10-01 ENCOUNTER — Ambulatory Visit (HOSPITAL_BASED_OUTPATIENT_CLINIC_OR_DEPARTMENT_OTHER)
Admission: RE | Admit: 2015-10-01 | Discharge: 2015-10-01 | Disposition: A | Discharge status: Home / Self Care | Payer: PRIVATE HEALTH INSURANCE | Source: Ambulatory Visit | Attending: Otolaryngology | Admitting: Otolaryngology

## 2015-10-01 ENCOUNTER — Encounter (HOSPITAL_BASED_OUTPATIENT_CLINIC_OR_DEPARTMENT_OTHER): Payer: Self-pay | Admitting: *Deleted

## 2015-10-01 ENCOUNTER — Encounter (HOSPITAL_BASED_OUTPATIENT_CLINIC_OR_DEPARTMENT_OTHER)
Admission: RE | Disposition: A | Discharge status: Home / Self Care | Payer: Self-pay | Source: Ambulatory Visit | Attending: Otolaryngology

## 2015-10-01 DIAGNOSIS — J3501 Chronic tonsillitis: Secondary | ICD-10-CM | POA: Insufficient documentation

## 2015-10-01 DIAGNOSIS — F17200 Nicotine dependence, unspecified, uncomplicated: Secondary | ICD-10-CM | POA: Insufficient documentation

## 2015-10-01 HISTORY — PX: TONSILLECTOMY: SHX5217

## 2015-10-01 HISTORY — DX: Acute tonsillitis, unspecified: J03.90

## 2015-10-01 SURGERY — TONSILLECTOMY
Anesthesia: General | Site: Mouth

## 2015-10-01 MED ORDER — SCOPOLAMINE 1 MG/3DAYS TD PT72: 1.0000 | MEDICATED_PATCH | Freq: Once | TRANSDERMAL | Status: DC | PRN

## 2015-10-01 MED ORDER — MIDAZOLAM HCL 2 MG/2ML IJ SOLN
INTRAMUSCULAR | Status: AC
  Filled 2015-10-01: qty 2

## 2015-10-01 MED ORDER — MIDAZOLAM HCL 2 MG/2ML IJ SOLN: 1.0000 mg | INTRAMUSCULAR | Status: DC | PRN

## 2015-10-01 MED ORDER — OXYCODONE HCL 5 MG/5ML PO SOLN
5.0000 mg | Freq: Once | ORAL | Status: AC | PRN
  Administered 2015-10-01: 5 mg via ORAL

## 2015-10-01 MED ORDER — SUCCINYLCHOLINE CHLORIDE 20 MG/ML IJ SOLN
INTRAMUSCULAR | Status: DC | PRN
Start: 2015-10-01 — End: 2015-10-01
  Administered 2015-10-01: 100 mg via INTRAVENOUS

## 2015-10-01 MED ORDER — FENTANYL CITRATE (PF) 100 MCG/2ML IJ SOLN: 50.0000 ug | INTRAMUSCULAR | Status: DC | PRN

## 2015-10-01 MED ORDER — FENTANYL CITRATE (PF) 100 MCG/2ML IJ SOLN
INTRAMUSCULAR | Status: AC
  Filled 2015-10-01: qty 2

## 2015-10-01 MED ORDER — GLYCOPYRROLATE 0.2 MG/ML IJ SOLN: 0.2000 mg | Freq: Once | INTRAMUSCULAR | Status: DC | PRN

## 2015-10-01 MED ORDER — PROPOFOL 10 MG/ML IV BOLUS
INTRAVENOUS | Status: DC | PRN
  Administered 2015-10-01: 200 mg via INTRAVENOUS

## 2015-10-01 MED ORDER — OXYCODONE HCL 5 MG/5ML PO SOLN
ORAL | Status: AC
  Filled 2015-10-01: qty 5

## 2015-10-01 MED ORDER — LACTATED RINGERS IV SOLN
INTRAVENOUS | Status: DC
  Administered 2015-10-01 (×2): via INTRAVENOUS

## 2015-10-01 MED ORDER — HYDROCODONE-ACETAMINOPHEN 7.5-325 MG/15ML PO SOLN: 10.0000 mL | ORAL | 0 refills | Status: DC | PRN

## 2015-10-01 MED ORDER — AZITHROMYCIN 200 MG/5ML PO SUSR: 200.0000 mg | Freq: Every day | ORAL | 0 refills | Status: AC

## 2015-10-01 MED ORDER — CEFAZOLIN SODIUM-DEXTROSE 2-4 GM/100ML-% IV SOLN
2.0000 g | INTRAVENOUS | Status: AC
  Administered 2015-10-01: 2 g via INTRAVENOUS

## 2015-10-01 MED ORDER — FENTANYL CITRATE (PF) 100 MCG/2ML IJ SOLN
25.0000 ug | INTRAMUSCULAR | Status: DC | PRN
  Administered 2015-10-01 (×3): 50 ug via INTRAVENOUS

## 2015-10-01 MED ORDER — DEXAMETHASONE SODIUM PHOSPHATE 4 MG/ML IJ SOLN
INTRAMUSCULAR | Status: DC | PRN
Start: 2015-10-01 — End: 2015-10-01
  Administered 2015-10-01: 10 mg via INTRAVENOUS

## 2015-10-01 MED ORDER — HYDROMORPHONE HCL 1 MG/ML IJ SOLN
0.2500 mg | INTRAMUSCULAR | Status: DC | PRN
  Administered 2015-10-01 (×2): 0.5 mg via INTRAVENOUS

## 2015-10-01 MED ORDER — PROPOFOL 10 MG/ML IV BOLUS
INTRAVENOUS | Status: AC
  Filled 2015-10-01: qty 20

## 2015-10-01 MED ORDER — SUFENTANIL CITRATE 50 MCG/ML IV SOLN
INTRAVENOUS | Status: DC | PRN
  Administered 2015-10-01 (×2): 10 ug via INTRAVENOUS

## 2015-10-01 MED ORDER — CEFAZOLIN SODIUM-DEXTROSE 2-4 GM/100ML-% IV SOLN
INTRAVENOUS | Status: AC
  Filled 2015-10-01: qty 100

## 2015-10-01 MED ORDER — PROMETHAZINE HCL 25 MG/ML IJ SOLN: 6.2500 mg | INTRAMUSCULAR | Status: DC | PRN

## 2015-10-01 MED ORDER — SUFENTANIL CITRATE 50 MCG/ML IV SOLN
INTRAVENOUS | Status: AC
  Filled 2015-10-01: qty 1

## 2015-10-01 MED ORDER — LIDOCAINE HCL (CARDIAC) 20 MG/ML IV SOLN
INTRAVENOUS | Status: DC | PRN
Start: 2015-10-01 — End: 2015-10-01
  Administered 2015-10-01: 50 mg via INTRAVENOUS

## 2015-10-01 MED ORDER — OXYCODONE HCL 5 MG PO TABS: 5.0000 mg | ORAL_TABLET | Freq: Once | ORAL | Status: AC | PRN

## 2015-10-01 MED ORDER — HYDROMORPHONE HCL 1 MG/ML IJ SOLN
INTRAMUSCULAR | Status: AC
  Filled 2015-10-01: qty 1

## 2015-10-01 SURGICAL SUPPLY — 31 items
BANDAGE COBAN STERILE 2 (GAUZE/BANDAGES/DRESSINGS) IMPLANT
CANISTER SUCT 1200ML W/VALVE (MISCELLANEOUS) ×3 IMPLANT
CATH ROBINSON RED A/P 12FR (CATHETERS) IMPLANT
COAGULATOR SUCT 6 FR SWTCH (ELECTROSURGICAL)
COAGULATOR SUCT SWTCH 10FR 6 (ELECTROSURGICAL) IMPLANT
COVER MAYO STAND STRL (DRAPES) ×3 IMPLANT
ELECT COATED BLADE 2.86 ST (ELECTRODE) ×3 IMPLANT
ELECT REM PT RETURN 9FT ADLT (ELECTROSURGICAL)
ELECT REM PT RETURN 9FT PED (ELECTROSURGICAL)
ELECTRODE REM PT RETRN 9FT PED (ELECTROSURGICAL) IMPLANT
ELECTRODE REM PT RTRN 9FT ADLT (ELECTROSURGICAL) IMPLANT
GLOVE BIO SURGEON STRL SZ 6.5 (GLOVE) ×2 IMPLANT
GLOVE BIO SURGEONS STRL SZ 6.5 (GLOVE) ×1
GLOVE BIOGEL PI IND STRL 7.0 (GLOVE) ×1 IMPLANT
GLOVE BIOGEL PI INDICATOR 7.0 (GLOVE) ×2
GLOVE SS BIOGEL STRL SZ 7.5 (GLOVE) ×1 IMPLANT
GLOVE SUPERSENSE BIOGEL SZ 7.5 (GLOVE) ×2
GOWN STRL REUS W/ TWL LRG LVL3 (GOWN DISPOSABLE) ×1 IMPLANT
GOWN STRL REUS W/TWL LRG LVL3 (GOWN DISPOSABLE) ×2
MARKER SKIN DUAL TIP RULER LAB (MISCELLANEOUS) IMPLANT
NS IRRIG 1000ML POUR BTL (IV SOLUTION) ×3 IMPLANT
PENCIL FOOT CONTROL (ELECTRODE) ×3 IMPLANT
SHEET MEDIUM DRAPE 40X70 STRL (DRAPES) ×3 IMPLANT
SOLUTION BUTLER CLEAR DIP (MISCELLANEOUS) ×3 IMPLANT
SPONGE GAUZE 4X4 12PLY STER LF (GAUZE/BANDAGES/DRESSINGS) ×3 IMPLANT
SPONGE TONSIL 1 RF SGL (DISPOSABLE) IMPLANT
SPONGE TONSIL 1.25 RF SGL STRG (GAUZE/BANDAGES/DRESSINGS) ×3 IMPLANT
SYR BULB 3OZ (MISCELLANEOUS) ×3 IMPLANT
TOWEL OR 17X24 6PK STRL BLUE (TOWEL DISPOSABLE) ×3 IMPLANT
TUBE CONNECTING 20'X1/4 (TUBING) ×1
TUBE CONNECTING 20X1/4 (TUBING) ×2 IMPLANT

## 2015-10-01 NOTE — Transfer of Care (Signed)
Immediate Anesthesia Transfer of Care Note  Patient: Daniel Kelley  Procedure(s) Performed: Procedure(s): TONSILLECTOMY (N/A)  Patient Location: PACU  Anesthesia Type:General  Level of Consciousness: awake, alert  and oriented  Airway & Oxygen Therapy: Patient Spontanous Breathing and Patient connected to face mask oxygen  Post-op Assessment: Report given to RN and Post -op Vital signs reviewed and stable  Post vital signs: Reviewed and stable  Last Vitals:  Vitals:   10/01/15 0931 10/01/15 1133  BP: (!) 149/95 (!) 170/109  Pulse: 78   Resp: 20 17  Temp: 36.8 C     Last Pain:  Vitals:   10/01/15 0931  TempSrc: Oral         Complications: No apparent anesthesia complications

## 2015-10-01 NOTE — Anesthesia Preprocedure Evaluation (Signed)
Anesthesia Evaluation  Patient identified by MRN, date of birth, ID band Patient awake    Reviewed: Allergy & Precautions, H&P , NPO status , Patient's Chart, lab work & pertinent test results  History of Anesthesia Complications Negative for: history of anesthetic complications  Airway Mallampati: II  TM Distance: >3 FB Neck ROM: full    Dental no notable dental hx.    Pulmonary neg pulmonary ROS, Current Smoker,    Pulmonary exam normal breath sounds clear to auscultation       Cardiovascular negative cardio ROS Normal cardiovascular exam Rhythm:regular Rate:Normal     Neuro/Psych negative neurological ROS     GI/Hepatic negative GI ROS, Neg liver ROS,   Endo/Other  negative endocrine ROS  Renal/GU negative Renal ROS     Musculoskeletal   Abdominal   Peds  Hematology negative hematology ROS (+)   Anesthesia Other Findings   Reproductive/Obstetrics negative OB ROS                             Anesthesia Physical Anesthesia Plan  ASA: II  Anesthesia Plan: General   Post-op Pain Management:    Induction: Intravenous  Airway Management Planned: Oral ETT  Additional Equipment:   Intra-op Plan:   Post-operative Plan: Extubation in OR  Informed Consent: I have reviewed the patients History and Physical, chart, labs and discussed the procedure including the risks, benefits and alternatives for the proposed anesthesia with the patient or authorized representative who has indicated his/her understanding and acceptance.   Dental Advisory Given  Plan Discussed with: Anesthesiologist, CRNA and Surgeon  Anesthesia Plan Comments:         Anesthesia Quick Evaluation

## 2015-10-01 NOTE — Anesthesia Postprocedure Evaluation (Signed)
Anesthesia Post Note  Patient: Daniel Kelley  Procedure(s) Performed: Procedure(s) (LRB): TONSILLECTOMY (N/A)  Patient location during evaluation: PACU Anesthesia Type: General Level of consciousness: awake and alert Pain management: pain level controlled Vital Signs Assessment: post-procedure vital signs reviewed and stable Respiratory status: spontaneous breathing, nonlabored ventilation, respiratory function stable and patient connected to nasal cannula oxygen Cardiovascular status: blood pressure returned to baseline and stable Postop Assessment: no signs of nausea or vomiting Anesthetic complications: no    Last Vitals:  Vitals:   10/01/15 1145 10/01/15 1149  BP: (!) 154/97   Pulse: 96 (!) 102  Resp: (!) 27 15  Temp:      Last Pain:  Vitals:   10/01/15 1149  TempSrc:   PainSc: 10-Worst pain ever                 Reino Kent

## 2015-10-01 NOTE — Anesthesia Procedure Notes (Signed)
Procedure Name: Intubation Date/Time: 10/01/2015 10:34 AM Performed by: Zenia Resides D Pre-anesthesia Checklist: Patient identified, Emergency Drugs available, Suction available and Patient being monitored Patient Re-evaluated:Patient Re-evaluated prior to inductionOxygen Delivery Method: Circle system utilized Preoxygenation: Pre-oxygenation with 100% oxygen Intubation Type: IV induction Ventilation: Mask ventilation without difficulty Laryngoscope Size: Mac and 3 Grade View: Grade II Tube type: Oral Tube size: 7.0 mm Number of attempts: 1 Airway Equipment and Method: Stylet and Oral airway Placement Confirmation: ETT inserted through vocal cords under direct vision,  positive ETCO2 and breath sounds checked- equal and bilateral Secured at: 24 cm Tube secured with: Tape Dental Injury: Teeth and Oropharynx as per pre-operative assessment

## 2015-10-01 NOTE — Brief Op Note (Signed)
10/01/2015  11:07 AM  PATIENT:  Daniel Kelley  30 y.o. male  PRE-OPERATIVE DIAGNOSIS:  chronic tonsilitis  POST-OPERATIVE DIAGNOSIS:  chronic tonsilitis  PROCEDURE:  Procedure(s): TONSILLECTOMY (N/A)  SURGEON:  Surgeon(s) and Role:    * Drema Halon, MD - Primary  PHYSICIAN ASSISTANT:   ASSISTANTS: none   ANESTHESIA:   general  EBL:  Total I/O In: -  Out: 20 [Blood:20]  BLOOD ADMINISTERED:none  DRAINS: none   LOCAL MEDICATIONS USED:  NONE  SPECIMEN:  No Specimen  DISPOSITION OF SPECIMEN:  N/A  COUNTS:  YES  TOURNIQUET:  * No tourniquets in log *  DICTATION: .Other Dictation: Dictation Number [id number]  PLAN OF CARE: Discharge to home after PACU  PATIENT DISPOSITION:  PACU - hemodynamically stable.   Delay start of Pharmacological VTE agent (>24hrs) due to surgical blood loss or risk of bleeding: yes

## 2015-10-01 NOTE — Interval H&P Note (Signed)
History and Physical Interval Note:  10/01/2015 9:59 AM  Daniel Kelley  has presented today for surgery, with the diagnosis of chronic tonsilitis  The various methods of treatment have been discussed with the patient and family. After consideration of risks, benefits and other options for treatment, the patient has consented to  Procedure(s): TONSILLECTOMY (N/A) as a surgical intervention .  The patient's history has been reviewed, patient examined, no change in status, stable for surgery.  I have reviewed the patient's chart and labs.  Questions were answered to the patient's satisfaction.     CHRISTOPHER NEWMAN

## 2015-10-01 NOTE — Discharge Instructions (Addendum)
Dr. Ezzard Standing will discuss sleep apnea with you at your follow-up appointment.  Instructions for Home Care After Tonsillectomy  First Day Home: Encourage fluid intake by frequently offering liquids, soup, ice cream jello, etc.  Drink several glasses of water.  Cooler fluids are best.  Avoid hot and highly seasoned foods.  Orange juice, grapefruit juice and tomato juice may cause stinging sensation because of their acidic content.    Second and Third Day Home: Continue liquids and add soft foods, (pudding, macaroni and cheese, mashed potatoes, soft scrambled eggs, etc.).  Make sure you drink plenty of liquids so you do not get dehydrated.  Fifth Thru Seventh Day Home: Gradually resume a normal diet, but avoid hot foods, potato chips, nuts, toast and crackers until 2 weeks after surgery.  General Instructions   No undue physical exertion or exercise for one week.  Children: Tylenol may be used for discomfort and/or fever.  Use as often as necessary within limits of the directions.  Adults: May spray throat with Chloroseptic or other topical anesthetic for discomfort and use pain medication obtained by prescription as directed.    A slight fever (up to 101) is expected for the first the first couple of days.  Take Tylenol (or aspirin substitute) as directed.  Pain in ears is common after tonsillectomy.  It represents pain referred from the throat where the tonsils were removed.  There is usually nothing wrong with the ears in most cases.  Administer Tylenol as needed to control this pain.  White patches will form where the tonsils were removed.  This is perfectly normal.  They will disappear in one to two weeks.  Mouth odor may be notice during the healing stages.  Do not use aspirin for two weeks; it increases the possibility of bleeding.  In a very small percentage of people, there is some bleeding after five to six days.  If this happens, do not become excited, for the bleeding is usually  light.  Be quiet, lie down, and spit the blood out gently.  Gargle the throat with ice water.  If the bleeding does not stop promptly, call the office [phone]), which answers 24 hours a day.  A follow up appointment should be made with Dr. Ezzard Standing 10-14 days following surgery. Please call [phone] for the appointment time.  Tylenol, motrin or hydrocodone elixir 10-15 cc (2-3 tsp) every 4 hrs prn pain Start zithromax antibiotic tonight or in am 1 tsp daily for 6 days   Post Anesthesia Home Care Instructions  Activity: Get plenty of rest for the remainder of the day. A responsible adult should stay with you for 24 hours following the procedure.  For the next 24 hours, DO NOT: -Drive a car -Advertising copywriter -Drink alcoholic beverages -Take any medication unless instructed by your physician -Make any legal decisions or sign important papers.  Meals: Start with liquid foods such as gelatin or soup. Progress to regular foods as tolerated. Avoid greasy, spicy, heavy foods. If nausea and/or vomiting occur, drink only clear liquids until the nausea and/or vomiting subsides. Call your physician if vomiting continues.  Special Instructions/Symptoms: Your throat may feel dry or sore from the anesthesia or the breathing tube placed in your throat during surgery. If this causes discomfort, gargle with warm salt water. The discomfort should disappear within 24 hours.  If you had a scopolamine patch placed behind your ear for the management of post- operative nausea and/or vomiting:  1. The medication in the patch is  effective for 72 hours, after which it should be removed.  Wrap patch in a tissue and discard in the trash. Wash hands thoroughly with soap and water. 2. You may remove the patch earlier than 72 hours if you experience unpleasant side effects which may include dry mouth, dizziness or visual disturbances. 3. Avoid touching the patch. Wash your hands with soap and water after contact with  the patch.

## 2015-10-04 ENCOUNTER — Encounter (HOSPITAL_BASED_OUTPATIENT_CLINIC_OR_DEPARTMENT_OTHER): Payer: Self-pay | Admitting: Otolaryngology

## 2015-10-04 NOTE — Op Note (Signed)
NAME:  Daniel Kelley, KRUMENACKER                      ACCOUNT NO.:  MEDICAL RECORD NO.:  [id number]  LOCATION:                                 FACILITY:  PHYSICIAN:  Kristine Garbe. Ezzard Standing, M.D. DATE OF BIRTH:  DATE OF PROCEDURE:  10/01/2015 DATE OF DISCHARGE:                              OPERATIVE REPORT   PREOPERATIVE DIAGNOSIS:  Recurrent tonsillitis and tonsillar hypertrophy.  POSTOPERATIVE DIAGNOSIS:  Recurrent tonsillitis and tonsillar hypertrophy.  OPERATION PERFORMED:  Tonsillectomy.  SURGEON:  Kristine Garbe. Ezzard Standing, M.D.  ANESTHESIA:  General endotracheal.  COMPLICATIONS:  None.  BRIEF CLINICAL NOTE:  Armel Sentman is a 30 year old gentleman, who has had frequent sore throats and has always had large tonsils.  On exam, he has 3+ tonsils.  He is taken to the operating room at this time for tonsillectomy.  DESCRIPTION OF PROCEDURE:  After adequate endotracheal anesthesia, the patient received 2 g Ancef IV preoperatively as well as 10 mg of Decadron.  The patient was placed in the supine position.  A mouth gag was used to expose the oropharynx.  The patient has a large tongue as well as large tonsils.  Tonsils were resected from tonsillar fossa using the cautery.  Care was taken to preserve the anterior and posterior tonsillar pillars as well as the uvula.  However, the tonsils were so large that they impeded a little bit on the uvula and caused very edematous uvula.  The distal 1/3 to 1/2 of the uvula was amputated following tonsillectomy.  Hemostasis was obtained with cautery.  The nasopharynx was examined, and he had minimal adenoid tissue and nothing was done in this area.  Oropharynx was irrigated with saline.  The patient was awoken from anesthesia and transferred to recovery room, postop doing well.  DISPOSITION:  Harlie will be discharged home later this morning on Zithromax suspension 2 teaspoons daily for the next 6 days along with Tylenol and hydrocodone elixir 10-15  mL q.4 hours p.r.n. pain.  We will have him follow up in my office in 10-14 days for recheck.         ______________________________ Kristine Garbe. Ezzard Standing, M.D.    CEN/MEDQ  D:  10/01/2015  T:  10/02/2015  Job:  [id number]

## 2015-10-04 NOTE — Addendum Note (Signed)
Addendum  created 10/04/15 4827 by Jewel Baize Torris House, CRNA   Charge Capture section accepted

## 2015-10-05 LAB — POCT HEMOGLOBIN-HEMACUE: Hemoglobin: 13.8 g/dL (ref 13.0–17.0)

## 2016-06-13 ENCOUNTER — Encounter (HOSPITAL_BASED_OUTPATIENT_CLINIC_OR_DEPARTMENT_OTHER): Payer: Self-pay | Admitting: Adult Health

## 2016-06-13 ENCOUNTER — Emergency Department (HOSPITAL_BASED_OUTPATIENT_CLINIC_OR_DEPARTMENT_OTHER)
Admission: EM | Admit: 2016-06-13 | Discharge: 2016-06-13 | Disposition: A | Discharge status: Home / Self Care | Payer: 59 | Attending: Emergency Medicine | Admitting: Emergency Medicine

## 2016-06-13 DIAGNOSIS — F17210 Nicotine dependence, cigarettes, uncomplicated: Secondary | ICD-10-CM | POA: Insufficient documentation

## 2016-06-13 DIAGNOSIS — H66003 Acute suppurative otitis media without spontaneous rupture of ear drum, bilateral: Secondary | ICD-10-CM | POA: Insufficient documentation

## 2016-06-13 DIAGNOSIS — H9203 Otalgia, bilateral: Secondary | ICD-10-CM | POA: Diagnosis present

## 2016-06-13 MED ORDER — AMOXICILLIN 500 MG PO CAPS: 500.0000 mg | ORAL_CAPSULE | Freq: Three times a day (TID) | ORAL | 0 refills | Status: DC

## 2016-06-13 MED FILL — AMOXICILLIN 500 MG CAPSULE: 500 | 10 days supply | Qty: 30 | Fill #0

## 2016-06-13 NOTE — ED Provider Notes (Signed)
[facility] Provider Note   CSN: [id number] Arrival date & time: 06/13/16  0915     History   Chief Complaint Chief Complaint  Patient presents with  . Otalgia    HPI Daniel Kelley is a 31 y.o. male.  Patient is a 31 year old male with no significant past medical history. He presents with bilateral ear congestion, fullness, and discomfort. He reports decreased hearing. Is always had some pressure in his years, however this has worsened over the past 24 hours. He denies any fevers or chills. He does report some recent URI symptoms.   The history is provided by the patient.  Otalgia  This is a new problem. The current episode started 2 days ago. The problem occurs constantly. The problem has been gradually worsening. There has been no fever. The pain is moderate.    Past Medical History:  Diagnosis Date  . Tonsillitis     There are no active problems to display for this patient.   Past Surgical History:  Procedure Laterality Date  . NO PAST SURGERIES    . TONSILLECTOMY N/A 10/01/2015   Procedure: TONSILLECTOMY;  Surgeon: Drema Halon, MD;  Location: [facility];  Service: ENT;  Laterality: N/A;       Home Medications    Prior to Admission medications   Medication Sig Start Date End Date Taking? Authorizing Provider  HYDROcodone-acetaminophen (HYCET) 7.5-325 mg/15 ml solution Take 10-15 mLs by mouth every 4 (four) hours as needed for moderate pain. 10/01/15   Drema Halon, MD    Family History History reviewed. No pertinent family history.  Social History Social History  Substance Use Topics  . Smoking status: Light Tobacco Smoker    Types: Cigarettes  . Smokeless tobacco: Former Neurosurgeon  . Alcohol use Yes     Allergies   Shrimp [shellfish allergy]   Review of Systems Review of Systems  HENT: Positive for ear pain.   All other systems reviewed and are negative.    Physical Exam Updated Vital Signs BP (!)  142/92 (BP Location: Right Arm)   Pulse 91   Temp 98.1 F (36.7 C) (Oral)   Resp 20   Ht  (1.676 m)   Wt 228 lb (103.4 kg)   SpO2 99%   BMI 36.80 kg/m   Physical Exam  Constitutional: He is oriented to person, place, and time. He appears well-developed and well-nourished. No distress.  HENT:  Head: Normocephalic and atraumatic.  Right Ear: External ear normal.  Left Ear: External ear normal.  Mouth/Throat: Oropharynx is clear and moist.  There is redness and erythema of the TMs bilaterally along with middle ear effusion.  Neck: Normal range of motion. Neck supple.  Pulmonary/Chest: Effort normal.  Lymphadenopathy:    He has no cervical adenopathy.  Neurological: He is alert and oriented to person, place, and time.  Skin: Skin is warm and dry. He is not diaphoretic.  Nursing note and vitals reviewed.    ED Treatments / Results  Labs (all labs ordered are listed, but only abnormal results are displayed) Labs Reviewed - No data to display  EKG  EKG Interpretation None       Radiology No results found.  Procedures Procedures (including critical care time)  Medications Ordered in ED Medications - No data to display   Initial Impression / Assessment and Plan / ED Course  I have reviewed the triage vital signs and the nursing notes.  Pertinent labs & imaging results  that were available during my care of the patient were reviewed by me and considered in my medical decision making (see chart for details).  Patient will be treated for otitis media with amoxicillin. He has requested follow-up with ENT as he reports a long-standing issue of pressure in his ears.  Final Clinical Impressions(s) / ED Diagnoses   Final diagnoses:  None    New Prescriptions New Prescriptions   No medications on file     Geoffery Lyons, MD 06/13/16 [phone]

## 2016-06-13 NOTE — ED Triage Notes (Signed)
PREsents with bilateral ear pain and tinnitus that began 2 days ago and is intermittent. TM visualised, no cerumen or redness to canal noted.

## 2016-06-13 NOTE — Discharge Instructions (Signed)
Amoxicillin as prescribed.  Over-the-counter decongestants as directed per package instructions.  The contact information for [facility] has been provided in this discharge summary at your request. Call to schedule an appointment.

## 2016-06-23 DIAGNOSIS — H903 Sensorineural hearing loss, bilateral: Secondary | ICD-10-CM | POA: Insufficient documentation

## 2016-10-03 ENCOUNTER — Ambulatory Visit
Admission: EM | Admit: 2016-10-03 | Discharge: 2016-10-03 | Disposition: A | Discharge status: Home / Self Care | Payer: PRIVATE HEALTH INSURANCE | Attending: Family Medicine | Admitting: Family Medicine

## 2016-10-03 DIAGNOSIS — Z0289 Encounter for other administrative examinations: Secondary | ICD-10-CM

## 2016-10-03 DIAGNOSIS — Z024 Encounter for examination for driving license: Secondary | ICD-10-CM

## 2016-10-03 LAB — DEPT OF TRANSP DIPSTICK, URINE (ARMC ONLY)
Glucose, UA: NEGATIVE mg/dL
Hgb urine dipstick: NEGATIVE
Protein, ur: NEGATIVE mg/dL
Specific Gravity, Urine: 1.025 (ref 1.005–1.030)

## 2016-10-03 NOTE — ED Triage Notes (Signed)
Patient is here for DOT Physical. Patient has no other complaints at this time.  

## 2016-10-03 NOTE — ED Provider Notes (Signed)
CSN: [id number]     Arrival date & time 10/03/16  1517 History   First MD Initiated Contact with Patient 10/03/16 1623     Chief Complaint  Patient presents with  . Commercial Driver's License Exam   (Consider location/radiation/quality/duration/timing/severity/associated sxs/prior Treatment) HPI  This a 31 year old male who presents for a DOT physical. He denies any medical past history. Surgical past history is significant for tonsillectomy.      Past Medical History:  Diagnosis Date  . Tonsillitis    Past Surgical History:  Procedure Laterality Date  . NO PAST SURGERIES    . TONSILLECTOMY N/A 10/01/2015   Procedure: TONSILLECTOMY;  Surgeon: Drema Halonhristopher E Newman, MD;  Location: [facility];  Service: ENT;  Laterality: N/A;   History reviewed. No pertinent family history. Social History  Substance Use Topics  . Smoking status: Never Smoker  . Smokeless tobacco: Former NeurosurgeonUser  . Alcohol use Yes     Comment: occasionally    Review of Systems  All other systems reviewed and are negative.   Allergies  Shrimp [shellfish allergy] and Penicillins  Home Medications   Prior to Admission medications   Medication Sig Start Date End Date Taking? Authorizing Provider  amoxicillin (AMOXIL) 500 MG capsule Take 1 capsule (500 mg total) by mouth 3 (three) times daily. 06/13/16   Geoffery Lyonselo, Douglas, MD  HYDROcodone-acetaminophen (HYCET) 7.5-325 mg/15 ml solution Take 10-15 mLs by mouth every 4 (four) hours as needed for moderate pain. 10/01/15   Drema HalonNewman, Christopher E, MD   Meds Ordered and Administered this Visit  Medications - No data to display  BP 140/83 (BP Location: Left Arm)   Pulse 86   Temp 98.5 F (36.9 C) (Oral)   Resp 16   Ht 5\' 6"  (1.676 m)   Wt 231 lb (104.8 kg)   SpO2 100%   BMI 37.28 kg/m  No data found.   Physical Exam  Constitutional:  Referred to DOT physical  Nursing note and vitals reviewed.   Urgent Care Course     Procedures (including  critical care time)  Labs Review Labs Reviewed  DEPT OF TRANSP DIPSTICK, URINE([facility] ONLY)    Imaging Review No results found.   Visual Acuity Review  Right Eye Distance: 20/30 (uncorrected) Left Eye Distance: 20/20 (uncorrected) Bilateral Distance: 20/20 (uncorrected)  Right Eye Near:   Left Eye Near:    Bilateral Near:         MDM   1. 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    Lutricia FeilRoemer, Sheril Hammond P, PA-C 10/03/16 [phone]

## 2017-03-30 ENCOUNTER — Other Ambulatory Visit: Payer: Self-pay

## 2017-03-30 ENCOUNTER — Emergency Department (HOSPITAL_BASED_OUTPATIENT_CLINIC_OR_DEPARTMENT_OTHER)
Admission: EM | Admit: 2017-03-30 | Discharge: 2017-03-30 | Disposition: A | Discharge status: Home / Self Care | Payer: 59 | Attending: Emergency Medicine | Admitting: Emergency Medicine

## 2017-03-30 DIAGNOSIS — R5383 Other fatigue: Secondary | ICD-10-CM | POA: Insufficient documentation

## 2017-03-30 DIAGNOSIS — R109 Unspecified abdominal pain: Secondary | ICD-10-CM | POA: Diagnosis present

## 2017-03-30 DIAGNOSIS — R319 Hematuria, unspecified: Secondary | ICD-10-CM | POA: Insufficient documentation

## 2017-03-30 DIAGNOSIS — N200 Calculus of kidney: Secondary | ICD-10-CM | POA: Diagnosis not present

## 2017-03-30 LAB — URINALYSIS, ROUTINE W REFLEX MICROSCOPIC
Bilirubin Urine: NEGATIVE
Glucose, UA: NEGATIVE mg/dL
KETONES UR: 15 mg/dL — AB
Leukocytes, UA: NEGATIVE
NITRITE: NEGATIVE
PROTEIN: NEGATIVE mg/dL
Specific Gravity, Urine: 1.02 (ref 1.005–1.030)
pH: 6.5 (ref 5.0–8.0)

## 2017-03-30 LAB — URINALYSIS, MICROSCOPIC (REFLEX): Squamous Epithelial / LPF: NONE SEEN

## 2017-03-30 MED ORDER — TAMSULOSIN HCL 0.4 MG PO CAPS: 0.4000 mg | ORAL_CAPSULE | Freq: Every day | ORAL | 0 refills | Status: AC

## 2017-03-30 MED FILL — TAMSULOSIN HCL 0.4 MG CAPS: 0.4 | 14 days supply | Qty: 14 | Fill #0

## 2017-03-30 NOTE — ED Notes (Signed)
ED Provider at bedside. 

## 2017-03-30 NOTE — ED Provider Notes (Signed)
[facility] EMERGENCY DEPARTMENT Provider Note   CSN: [id number] Arrival date & time: 03/30/17  [id number]     History   Chief Complaint Chief Complaint  Patient presents with  . Flank Pain    HPI Daniel Kelley is a 32 y.o. male.  HPI  Yesterday began to pink colored urine Last night got home from work around midnight, woke up to urinate again and noticed blood in the toilet 4AM started to have left flank pain, severe, tried to walk or roll it off Went to urinate again and pushed out large white stone,  And passed smaller stone Then pain went away No pain now. Just fatigue. No dysuria, no nausea or vomiting, no fevers No abdominal pain (was left side before)  Past Medical History:  Diagnosis Date  . Tonsillitis     There are no active problems to display for this patient.   Past Surgical History:  Procedure Laterality Date  . NO PAST SURGERIES    . TONSILLECTOMY N/A 10/01/2015   Procedure: TONSILLECTOMY;  Surgeon: Drema Halonhristopher E Newman, MD;  Location: [facility];  Service: ENT;  Laterality: N/A;       Home Medications    Prior to Admission medications   Medication Sig Start Date End Date Taking? Authorizing Provider  amoxicillin (AMOXIL) 500 MG capsule Take 1 capsule (500 mg total) by mouth 3 (three) times daily. 06/13/16   Geoffery Lyonselo, Douglas, MD  HYDROcodone-acetaminophen (HYCET) 7.5-325 mg/15 ml solution Take 10-15 mLs by mouth every 4 (four) hours as needed for moderate pain. 10/01/15   Drema HalonNewman, Christopher E, MD  tamsulosin (FLOMAX) 0.4 MG CAPS capsule Take 1 capsule (0.4 mg total) by mouth daily for 14 days. 03/30/17 04/13/17  Alvira MondaySchlossman, Heavenlee Maiorana, MD    Family History No family history on file.  Social History Social History   Tobacco Use  . Smoking status: Never Smoker  . Smokeless tobacco: Former Engineer, waterUser  Substance Use Topics  . Alcohol use: Yes    Comment: occasionally  . Drug use: No     Allergies   Shrimp [shellfish allergy] and  Penicillins   Review of Systems Review of Systems  Constitutional: Negative for fever.  HENT: Negative for sore throat.   Eyes: Negative for visual disturbance.  Respiratory: Negative for shortness of breath.   Cardiovascular: Negative for chest pain.  Gastrointestinal: Positive for abdominal pain. Negative for constipation, diarrhea, nausea and vomiting.  Genitourinary: Positive for flank pain and hematuria. Negative for difficulty urinating and dysuria.  Musculoskeletal: Negative for back pain and neck stiffness.  Skin: Negative for rash.  Neurological: Negative for syncope and headaches.     Physical Exam Updated Vital Signs BP (!) 162/111 (BP Location: Right Arm)   Pulse 79   Temp 97.8 F (36.6 C) (Oral)   Resp 18   SpO2 98%   Physical Exam  Constitutional: He is oriented to person, place, and time. He appears well-developed and well-nourished. No distress.  HENT:  Head: Normocephalic and atraumatic.  Eyes: Conjunctivae and EOM are normal.  Neck: Normal range of motion.  Cardiovascular: Normal rate, regular rhythm, normal heart sounds and intact distal pulses. Exam reveals no gallop and no friction rub.  No murmur heard. Pulmonary/Chest: Effort normal and breath sounds normal. No respiratory distress. He has no wheezes. He has no rales.  Abdominal: Soft. He exhibits no distension. There is no tenderness. There is no guarding.  Musculoskeletal: He exhibits no edema.  Neurological: He is alert and oriented to  person, place, and time.  Skin: Skin is warm and dry. He is not diaphoretic.  Nursing note and vitals reviewed.    ED Treatments / Results  Labs (all labs ordered are listed, but only abnormal results are displayed) Labs Reviewed  URINE CULTURE  URINALYSIS, ROUTINE W REFLEX MICROSCOPIC    EKG  EKG Interpretation None       Radiology No results found.  Procedures Procedures (including critical care time)  Medications Ordered in ED Medications -  No data to display   Initial Impression / Assessment and Plan / ED Course  I have reviewed the triage vital signs and the nursing notes.  Pertinent labs & imaging results that were available during my care of the patient were reviewed by me and considered in my medical decision making (see chart for details).    32yo male with no significant medical history presents with concern for hematuria and left flank pain which has resolved.  History is consistent with nephrolithiasis, patient noting passage of 2 stones in his urine, and suspect he had renal colic due to nephrolithiasis, now passed stones with resolution of symptoms.  Urinalysis shows hematuria without signs of infection.  Patient with some hypertension in the emergency department, and recommend following up with primary care doctor for recheck as possible this is situational.  Given brief duration of flank pain without history of other renal disorders, do not suspect significant electrolyte abnormality or fluctuation of Cr and do not feel emergent labs are indicated in setting of resolved symptoms.    Discussed hydration to avoid kidney stones, provided rx for 14 days of flomax and recommend follow up with PCP. Provided number for Urology if he develops additional symptoms. Patient discharged in stable condition with understanding of reasons to return.   Final Clinical Impressions(s) / ED Diagnoses   Final diagnoses:  Left flank pain  Nephrolithiasis    ED Discharge Orders        Ordered    tamsulosin (FLOMAX) 0.4 MG CAPS capsule  Daily     03/30/17 0754       Alvira Monday, MD 03/30/17 [phone]

## 2017-03-30 NOTE — ED Triage Notes (Signed)
Pt reports having sharp flank pain to the left side that radiated into his LLQ and groin. Pt reports noticing blood in his urine and "passing a small white rock". Pt pain decreased. Pt in NAD on assessment.

## 2017-03-31 LAB — URINE CULTURE: Culture: NO GROWTH

## 2017-08-23 ENCOUNTER — Encounter: Payer: Self-pay | Admitting: Family Medicine

## 2017-08-23 ENCOUNTER — Ambulatory Visit (INDEPENDENT_AMBULATORY_CARE_PROVIDER_SITE_OTHER): Payer: 59 | Admitting: Family Medicine

## 2017-08-23 VITALS — BP 160/110 | HR 87 | Temp 98.6°F | Resp 16 | Ht 66.0 in | Wt 233.0 lb

## 2017-08-23 DIAGNOSIS — Z131 Encounter for screening for diabetes mellitus: Secondary | ICD-10-CM

## 2017-08-23 DIAGNOSIS — I10 Essential (primary) hypertension: Secondary | ICD-10-CM | POA: Insufficient documentation

## 2017-08-23 DIAGNOSIS — R748 Abnormal levels of other serum enzymes: Secondary | ICD-10-CM | POA: Diagnosis not present

## 2017-08-23 DIAGNOSIS — Z114 Encounter for screening for human immunodeficiency virus [HIV]: Secondary | ICD-10-CM

## 2017-08-23 DIAGNOSIS — E785 Hyperlipidemia, unspecified: Secondary | ICD-10-CM

## 2017-08-23 DIAGNOSIS — E663 Overweight: Secondary | ICD-10-CM

## 2017-08-23 DIAGNOSIS — Z23 Encounter for immunization: Secondary | ICD-10-CM | POA: Diagnosis not present

## 2017-08-23 LAB — POCT URINALYSIS DIPSTICK
BILIRUBIN UA: NEGATIVE
GLUCOSE UA: NEGATIVE
KETONES UA: NEGATIVE
Leukocytes, UA: NEGATIVE
Nitrite, UA: NEGATIVE
PH UA: 6 (ref 5.0–8.0)
Protein, UA: POSITIVE — AB
RBC UA: NEGATIVE
Spec Grav, UA: 1.025 (ref 1.010–1.025)
UROBILINOGEN UA: 0.2 U/dL

## 2017-08-23 LAB — POCT GLYCOSYLATED HEMOGLOBIN (HGB A1C): Hemoglobin A1C: 5.3 % (ref 4.0–5.6)

## 2017-08-23 MED ORDER — AMLODIPINE BESYLATE 5 MG PO TABS: 5.0000 mg | ORAL_TABLET | Freq: Every day | ORAL | 5 refills | Status: DC

## 2017-08-23 NOTE — Progress Notes (Signed)
Subjective:    Patient ID: Daniel Kelley, male    DOB: 04-03-1985, 32 y.o.   MRN: [id number]  HPI Daniel Kelley,a 32 year old male with a history of hypertension presents to establish care.  Patient states that he has not had a primary provider over the past several years.  He was recently evaluated in urgent care and told that he needed to find a primary provider due to elevated blood pressure.  Patient states that he has not been monitoring blood pressure at home and does not follow a low-fat, low-sodium diet.  He has a family history of blood pressure in both his mother and father.  Patient is unaware of heart disease.  On reviewing recent labs, patient was found to have elevated cholesterol levels.  He currently denies headache, dizziness, chest pain, heart palpitations, fatigue, blurred vision, nausea, vomiting, or diarrhea.  Past Medical History:  Diagnosis Date  . Tonsillitis    Social History   Socioeconomic History  . Marital status: Single    Spouse name: Not on file  . Number of children: Not on file  . Years of education: Not on file  . Highest education level: Not on file  Occupational History  . Not on file  Social Needs  . Financial resource strain: Not on file  . Food insecurity:    Worry: Not on file    Inability: Not on file  . Transportation needs:    Medical: Not on file    Non-medical: Not on file  Tobacco Use  . Smoking status: Former Smoker    Types: Cigarettes  . Smokeless tobacco: Former Engineer, waterUser  Substance and Sexual Activity  . Alcohol use: Yes    Comment: couple times weekly   . Drug use: No  . Sexual activity: Not on file  Lifestyle  . Physical activity:    Days per week: Not on file    Minutes per session: Not on file  . Stress: Not on file  Relationships  . Social connections:    Talks on phone: Not on file    Gets together: Not on file    Attends religious service: Not on file    Active member of club or organization: Not on file    Attends  meetings of clubs or organizations: Not on file    Relationship status: Not on file  . Intimate partner violence:    Fear of current or ex partner: Not on file    Emotionally abused: Not on file    Physically abused: Not on file    Forced sexual activity: Not on file  Other Topics Concern  . Not on file  Social History Narrative  . Not on file   There is no immunization history for the selected administration types on file for this patient.   Review of Systems  HENT: Negative.   Eyes: Negative.   Respiratory: Negative.   Cardiovascular: Negative.   Gastrointestinal: Negative.   Endocrine: Negative.   Musculoskeletal: Negative.   Skin: Negative.   Allergic/Immunologic: Negative.   Neurological: Negative.   Hematological: Negative.   Psychiatric/Behavioral: Negative.        Objective:   Physical Exam  Constitutional: He appears well-developed and well-nourished.  Eyes: Pupils are equal, round, and reactive to light.  Neck: Normal range of motion.  Cardiovascular: Normal rate, regular rhythm, normal heart sounds and intact distal pulses.  Pulmonary/Chest: Effort normal and breath sounds normal.  Abdominal: Soft.  Musculoskeletal: Normal range of motion.  Neurological: He is alert.  Skin: Skin is warm and dry.  Psychiatric: He has a normal mood and affect. His behavior is normal. Judgment and thought content normal.         BP (!) 160/110 (BP Location: Left Arm, Patient Position: Sitting, Cuff Size: Large) Comment: manually  Pulse 87   Temp 98.6 F (37 C) (Oral)   Resp 16   Ht 5\' 6"  (1.676 m)   Wt 233 lb (105.7 kg)   SpO2 98%   BMI 37.61 kg/m  Assessment & Plan:  1. Essential hypertension Blood pressure is above goal, will start a trial of amlodipine 5 mg daily.  Will return to office for blood pressure check in 1 week.  Recommended DASH diet, and given written information. - Continue medication, monitor blood pressure at home. Continue DASH diet. Reminder to  go to the ER if any CP, SOB, nausea, dizziness, severe HA, changes vision/speech, left arm numbness and tingling and jaw pain.    - Urinalysis Dipstick - CBC - amLODipine (NORVASC) 5 MG tablet; Take 1 tablet (5 mg total) by mouth daily.  Dispense: 30 tablet; Refill: 5  2. Diabetes mellitus screening  - HgB A1c  3. Hyperlipidemia LDL goal <100 Reviewed previous lipid panel.  Total cholesterol elevated,  4. Elevated liver enzymes - Comprehensive metabolic panel  5. Screening for HIV (human immunodeficiency virus) - HIV antibody (with reflex)  6. Overweight Body mass index is 37.61 kg/m. Recommend a lowfat, low carbohydrate diet divided over 5-6 small meals, increase water intake to 6-8 glasses, and 150 minutes per week of cardiovascular exercise.    7. Need for Tdap vaccination - Tdap vaccine greater than or equal to 7yo IM   RTC: One month for hypertension in 1 week for blood pressure check    The patient was given clear instructions to go to ER or return to medical center if symptoms do not improve, worsen or new problems develop. The patient verbalized understanding.     Nolon Nations  MSN, FNP-C [facility] [address] [place], [place] [address] [phone]

## 2017-08-23 NOTE — Patient Instructions (Addendum)
Your blood pressure is above goal today without medication, blood pressure goal is less than 140/90.  Will start a trial of amlodipine 5 mg daily.  Also, start a DASH diet, which is a diet that is low in sodium and fat.  I will provide written information pertaining to DASH diet.  I also recommend daily exercise at least 5 days/week or 150 minutes/week.  Also, increase your water intake to about 64 ounces per day.   I will follow-up by phone with any abnormal laboratory results.  Your previous cholesterol levels are elevated, if they continue to be elevated we will need to start statin therapy and a daily aspirin.  After starting statin therapy, watch for signs of muscle aches or increased pain.   We will record your Tdap vaccination on the [facility]  I recommend a yearly eye exam and dental evaluations twice yearly.   Dentists Dr. Lesleigh Noeyan [place] Dr. Wayland DenisEnrico Silva  What is this medicine? AMLODIPINE (am LOE di peen) is a calcium-channel blocker. It affects the amount of calcium found in your heart and muscle cells. This relaxes your blood vessels, which can reduce the amount of work the heart has to do. This medicine is used to lower high blood pressure. It is also used to prevent chest pain. This medicine may be used for other purposes; ask your health care provider or pharmacist if you have questions. COMMON BRAND NAME(S): Norvasc What should I tell my health care provider before I take this medicine? They need to know if you have any of these conditions: -heart problems like heart failure or aortic stenosis -liver disease -an unusual or allergic reaction to amlodipine, other medicines, foods, dyes, or preservatives -pregnant or trying to get pregnant -breast-feeding How should I use this medicine? Take this medicine by mouth with a glass of water. Follow the directions on the prescription label. Take your medicine at regular intervals. Do not take more medicine  than directed. Talk to your pediatrician regarding the use of this medicine in children. Special care may be needed. This medicine has been used in children as young as 6. Persons over 33 years old may have a stronger reaction to this medicine and need smaller doses. Overdosage: If you think you have taken too much of this medicine contact a poison control center or emergency room at once. NOTE: This medicine is only for you. Do not share this medicine with others. What if I miss a dose? If you miss a dose, take it as soon as you can. If it is almost time for your next dose, take only that dose. Do not take double or extra doses. What may interact with this medicine? -herbal or dietary supplements -local or general anesthetics -medicines for high blood pressure -medicines for prostate problems -rifampin This list may not describe all possible interactions. Give your health care provider a list of all the medicines, herbs, non-prescription drugs, or dietary supplements you use. Also tell them if you smoke, drink alcohol, or use illegal drugs. Some items may interact with your medicine. What should I watch for while using this medicine? Visit your doctor or health care professional for regular check ups. Check your blood pressure and pulse rate regularly. Ask your health care professional what your blood pressure and pulse rate should be, and when you should contact him or her. This medicine may make you feel confused, dizzy or lightheaded. Do not drive, use machinery, or do anything that needs mental alertness until  you know how this medicine affects you. To reduce the risk of dizzy or fainting spells, do not sit or stand up quickly, especially if you are an older patient. Avoid alcoholic drinks; they can make you more dizzy. Do not suddenly stop taking amlodipine. Ask your doctor or health care professional how you can gradually reduce the dose. What side effects may I notice from receiving this  medicine? Side effects that you should report to your doctor or health care professional as soon as possible: -allergic reactions like skin rash, itching or hives, swelling of the face, lips, or tongue -breathing problems -changes in vision or hearing -chest pain -fast, irregular heartbeat -swelling of legs or ankles Side effects that usually do not require medical attention (report to your doctor or health care professional if they continue or are bothersome): -dry mouth -facial flushing -nausea, vomiting -stomach gas, pain -tired, weak -trouble sleeping This list may not describe all possible side effects. Call your doctor for medical advice about side effects. You may report side effects to FDA at 1-800-FDA-1088. Where should I keep my medicine? Keep out of the reach of children. Store at room temperature between 59 and 86 degrees F (15 and 30 degrees C). Protect from light. Keep container tightly closed. Throw away any unused medicine after the expiration date. NOTE: This sheet is a summary. It may not cover all possible information. If you have questions about this medicine, talk to your doctor, pharmacist, or health care provider.  2018 Elsevier/Gold Standard (2012-01-19 11:40:58)

## 2017-08-24 LAB — CBC
Hematocrit: 40.8 % (ref 37.5–51.0)
Hemoglobin: 13.9 g/dL (ref 13.0–17.7)
MCH: 30.8 pg (ref 26.6–33.0)
MCHC: 34.1 g/dL (ref 31.5–35.7)
MCV: 91 fL (ref 79–97)
PLATELETS: 268 10*3/uL (ref 150–450)
RBC: 4.51 x10E6/uL (ref 4.14–5.80)
RDW: 14.1 % (ref 12.3–15.4)
WBC: 4.8 10*3/uL (ref 3.4–10.8)

## 2017-08-24 LAB — COMPREHENSIVE METABOLIC PANEL
A/G RATIO: 1.9 (ref 1.2–2.2)
ALT: 44 IU/L (ref 0–44)
AST: 36 IU/L (ref 0–40)
Albumin: 5 g/dL (ref 3.5–5.5)
Alkaline Phosphatase: 62 IU/L (ref 39–117)
BUN/Creatinine Ratio: 12 (ref 9–20)
BUN: 12 mg/dL (ref 6–20)
Bilirubin Total: 0.2 mg/dL (ref 0.0–1.2)
CALCIUM: 9.9 mg/dL (ref 8.7–10.2)
CO2: 20 mmol/L (ref 20–29)
Chloride: 101 mmol/L (ref 96–106)
Creatinine, Ser: 1.01 mg/dL (ref 0.76–1.27)
GFR calc Af Amer: 112 mL/min/{1.73_m2} (ref >59)
GFR, EST NON AFRICAN AMERICAN: 97 mL/min/{1.73_m2} (ref >59)
GLOBULIN, TOTAL: 2.6 g/dL (ref 1.5–4.5)
Glucose: 99 mg/dL (ref 65–99)
POTASSIUM: 4.2 mmol/L (ref 3.5–5.2)
SODIUM: 140 mmol/L (ref 134–144)
Total Protein: 7.6 g/dL (ref 6.0–8.5)

## 2017-08-24 LAB — HIV ANTIBODY (ROUTINE TESTING W REFLEX): HIV SCREEN 4TH GENERATION: NONREACTIVE

## 2017-08-31 ENCOUNTER — Ambulatory Visit: Payer: 59

## 2017-08-31 VITALS — BP 130/82

## 2017-08-31 DIAGNOSIS — I10 Essential (primary) hypertension: Secondary | ICD-10-CM

## 2017-11-23 ENCOUNTER — Encounter: Payer: Self-pay | Admitting: Family Medicine

## 2017-11-23 ENCOUNTER — Ambulatory Visit (INDEPENDENT_AMBULATORY_CARE_PROVIDER_SITE_OTHER): Payer: PRIVATE HEALTH INSURANCE | Admitting: Family Medicine

## 2017-11-23 VITALS — BP 142/82 | HR 91 | Temp 98.1°F | Resp 16 | Ht 66.0 in | Wt 231.0 lb

## 2017-11-23 DIAGNOSIS — I10 Essential (primary) hypertension: Secondary | ICD-10-CM | POA: Diagnosis not present

## 2017-11-23 DIAGNOSIS — J301 Allergic rhinitis due to pollen: Secondary | ICD-10-CM | POA: Diagnosis not present

## 2017-11-23 LAB — POCT URINALYSIS DIPSTICK
Bilirubin, UA: NEGATIVE
Blood, UA: NEGATIVE
Glucose, UA: NEGATIVE
Ketones, UA: NEGATIVE
Leukocytes, UA: NEGATIVE
Nitrite, UA: NEGATIVE
Protein, UA: NEGATIVE
Spec Grav, UA: 1.01 (ref 1.010–1.025)
Urobilinogen, UA: 0.2 E.U./dL
pH, UA: 7 (ref 5.0–8.0)

## 2017-11-23 MED ORDER — LOSARTAN POTASSIUM 50 MG PO TABS: 50.0000 mg | ORAL_TABLET | Freq: Every day | ORAL | 1 refills | Status: DC

## 2017-11-23 MED ORDER — FLUTICASONE PROPIONATE 50 MCG/ACT NA SUSP: 2.0000 | Freq: Every day | NASAL | 6 refills | Status: DC

## 2017-11-23 MED ORDER — CLONIDINE HCL 0.1 MG PO TABS
0.1000 mg | ORAL_TABLET | Freq: Once | ORAL | Status: AC
  Administered 2017-11-23: 0.1 mg via ORAL

## 2017-11-23 NOTE — Progress Notes (Signed)
[facility]  Provider: Mike Gip, FNP   Hypertension Follow Up Visit  SUBJECTIVE:  Daniel Kelley is a 32 y.o. male who  has a past medical history of Tonsillitis. .   New concerns: Patient states that his BP medication is causing fatigue. He states that he started taking it at night and was groggy the next morning. He stopped the medication over 2 weeks ago. Patient states that he was not taking it consistently.  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 Patient states that he stopped adding salt to his food.  Has not been exercising.   Current Outpatient Medications  Medication Sig Dispense Refill  . amLODipine (NORVASC) 5 MG tablet Take 1 tablet (5 mg total) by mouth daily. (Patient not taking: Reported on 11/23/2017) 30 tablet 5   No current facility-administered medications for this visit.     Recent Results (from the past 2160 hour(s))  Urinalysis Dipstick     Status: Normal   Collection Time: 11/23/17  3:26 PM  Result Value Ref Range   Color, UA     Clarity, UA     Glucose, UA Negative Negative   Bilirubin, UA neg    Ketones, UA neg    Spec Grav, UA 1.010 1.010 - 1.025   Blood, UA neg    pH, UA 7.0 5.0 - 8.0   Protein, UA Negative Negative   Urobilinogen, UA 0.2 0.2 or 1.0 E.U./dL   Nitrite, UA neg    Leukocytes, UA Negative Negative   Appearance     Odor      Review of Systems  Constitutional: Negative.   HENT: Negative.   Eyes: Negative.   Respiratory: Negative.   Cardiovascular: Negative.   Gastrointestinal: Negative.   Genitourinary: Negative.   Musculoskeletal: Negative.   Skin: Negative.   Neurological: Negative.   Psychiatric/Behavioral: Negative.      OBJECTIVE:   BP (!) 162/102 (BP Location: Left Arm, Patient Position: Sitting, Cuff Size: Normal) Comment: manually  Pulse 91   Temp 98.1 F (36.7 C) (Oral)   Resp 16   Ht 5\' 6"  (1.676 m)   Wt 231 lb  (104.8 kg)   SpO2 97%   BMI 37.28 kg/m   Physical Exam  Constitutional: He is oriented to person, place, and time. He appears well-developed and well-nourished. No distress.  HENT:  Head: Normocephalic and atraumatic.  Eyes: Pupils are equal, round, and reactive to light. Conjunctivae and EOM are normal.  Neck: Normal range of motion. Neck supple. No thyromegaly present.  Cardiovascular: Normal rate, normal heart sounds and intact distal pulses.  No murmur heard. Pulmonary/Chest: Effort normal and breath sounds normal. No respiratory distress.  Abdominal: Soft. Bowel sounds are normal. He exhibits no distension.  Musculoskeletal: Normal range of motion. He exhibits no edema or tenderness.  Lymphadenopathy:    He has no cervical adenopathy.  Neurological: He is alert and oriented to person, place, and time.  Skin: Skin is warm and dry.  Psychiatric: He has a normal mood and affect. His behavior is normal. Judgment and thought content normal.  Nursing note and vitals reviewed.    ASSESSMENT/PLAN:   1. Essential hypertension Patient given 0.1mg  Clonidine in the office visit.  - Urinalysis Dipstick - cloNIDine (CATAPRES) tablet 0.1 mg Start losartan 50mg  po QD.   The patient is asked to make an attempt to improve diet and exercise patterns to aid in  medical management of this problem.  Return to care as scheduled and prn. Patient verbalized understanding and agreed with plan of care.    Ms. Daniel Kelley [name], FNP-BC [facility] [address]  [place], [place] [id number] [phone]

## 2017-11-23 NOTE — Patient Instructions (Addendum)
Mediterranean Diet A Mediterranean diet refers to food and lifestyle choices that are based on the traditions of countries located on the [employer]. This way of eating has been shown to help prevent certain conditions and improve outcomes for people who have chronic diseases, like kidney disease and heart disease. What are tips for following this plan? Lifestyle  Cook and eat meals together with your family, when possible.  Drink enough fluid to keep your urine clear or pale yellow.  Be physically active every day. This includes: ? Aerobic exercise like running or swimming. ? Leisure activities like gardening, walking, or housework.  Get 7-8 hours of sleep each night.  If recommended by your health care provider, drink red wine in moderation. This means 1 glass a day for nonpregnant women and 2 glasses a day for men. A glass of wine equals 5 oz (150 mL). Reading food labels  Check the serving size of packaged foods. For foods such as rice and pasta, the serving size refers to the amount of cooked product, not dry.  Check the total fat in packaged foods. Avoid foods that have saturated fat or trans fats.  Check the ingredients list for added sugars, such as corn syrup. Shopping  At the grocery store, buy most of your food from the areas near the walls of the store. This includes: ? Fresh fruits and vegetables (produce). ? Grains, beans, nuts, and seeds. Some of these may be available in unpackaged forms or large amounts (in bulk). ? Fresh seafood. ? Poultry and eggs. ? Low-fat dairy products.  Buy whole ingredients instead of prepackaged foods.  Buy fresh fruits and vegetables in-season from local farmers markets.  Buy frozen fruits and vegetables in resealable bags.  If you do not have access to quality fresh seafood, buy precooked frozen shrimp or canned fish, such as tuna, salmon, or sardines.  Buy small amounts of raw or cooked vegetables, salads, or olives from  the deli or salad bar at your store.  Stock your pantry so you always have certain foods on hand, such as olive oil, canned tuna, canned tomatoes, rice, pasta, and beans. Cooking  Cook foods with extra-virgin olive oil instead of using butter or other vegetable oils.  Have meat as a side dish, and have vegetables or grains as your main dish. This means having meat in small portions or adding small amounts of meat to foods like pasta or stew.  Use beans or vegetables instead of meat in common dishes like chili or lasagna.  Experiment with different cooking methods. Try roasting or broiling vegetables instead of steaming or sauteing them.  Add frozen vegetables to soups, stews, pasta, or rice.  Add nuts or seeds for added healthy fat at each meal. You can add these to yogurt, salads, or vegetable dishes.  Marinate fish or vegetables using olive oil, lemon juice, garlic, and fresh herbs. Meal planning  Plan to eat 1 vegetarian meal one day each week. Try to work up to 2 vegetarian meals, if possible.  Eat seafood 2 or more times a week.  Have healthy snacks readily available, such as: ? Vegetable sticks with hummus. ? [place] yogurt. ? Fruit and nut trail mix.  Eat balanced meals throughout the week. This includes: ? Fruit: 2-3 servings a day ? Vegetables: 4-5 servings a day ? Low-fat dairy: 2 servings a day ? Fish, poultry, or lean meat: 1 serving a day ? Beans and legumes: 2 or more servings a week ? Nuts  and seeds: 1-2 servings a day ? Whole grains: 6-8 servings a day ? Extra-virgin olive oil: 3-4 servings a day  Limit red meat and sweets to only a few servings a month What are my food choices?  Mediterranean diet ? Recommended ? Grains: Whole-grain pasta. Brown rice. Bulgar wheat. Polenta. Couscous. Whole-wheat bread. Orpah Cobb. ? Vegetables: Artichokes. Beets. Broccoli. Cabbage. Carrots. Eggplant. Green beans. Chard. Kale. Spinach. Onions. Leeks. Peas. Squash.  Tomatoes. Peppers. Radishes. ? Fruits: Apples. Apricots. Avocado. Berries. Bananas. Cherries. Dates. Figs. Grapes. Lemons. Melon. Oranges. Peaches. Plums. Pomegranate. ? Meats and other protein foods: Beans. Almonds. Sunflower seeds. Pine nuts. Peanuts. Cod. Salmon. Scallops. Shrimp. Tuna. Tilapia. Clams. Oysters. Eggs. ? Dairy: Low-fat milk. Cheese. Greek yogurt. ? Beverages: Water. Red wine. Herbal tea. ? Fats and oils: Extra virgin olive oil. Avocado oil. Grape seed oil. ? Sweets and desserts: [place] yogurt with honey. Baked apples. Poached pears. Trail mix. ? Seasoning and other foods: Basil. Cilantro. Coriander. Cumin. Mint. Parsley. Sage. Rosemary. Tarragon. Garlic. Oregano. Thyme. Pepper. Balsalmic vinegar. Tahini. Hummus. Tomato sauce. Olives. Mushrooms. ? Limit these ? Grains: Prepackaged pasta or rice dishes. Prepackaged cereal with added sugar. ? Vegetables: Deep fried potatoes (french fries). ? Fruits: Fruit canned in syrup. ? Meats and other protein foods: Beef. Pork. Lamb. Poultry with skin. Hot dogs. Tomasa Blase. ? Dairy: Ice cream. Sour cream. Whole milk. ? Beverages: Juice. Sugar-sweetened soft drinks. Beer. Liquor and spirits. ? Fats and oils: Butter. Canola oil. Vegetable oil. Beef fat (tallow). Lard. ? Sweets and desserts: Cookies. Cakes. Pies. Candy. ? Seasoning and other foods: Mayonnaise. Premade sauces and marinades. ? The items listed may not be a complete list. Talk with your dietitian about what dietary choices are right for you. Summary  The Mediterranean diet includes both food and lifestyle choices.  Eat a variety of fresh fruits and vegetables, beans, nuts, seeds, and whole grains.  Limit the amount of red meat and sweets that you eat.  Talk with your health care provider about whether it is safe for you to drink red wine in moderation. This means 1 glass a day for nonpregnant women and 2 glasses a day for men. A glass of wine equals 5 oz (150 mL). This information  is not intended to replace advice given to you by your health care provider. Make sure you discuss any questions you have with your health care provider. Document Released: 10/14/2015 Document Revised: 11/16/2015 Document Reviewed: 10/14/2015 Elsevier Interactive Patient Education  2018 [employer].  Sinus Rinse  A sinus rinse is a home treatment. It rinses your sinuses with a mixture of salt and water (saline solution). Sinuses are air-filled spaces in your skull behind the bones of your face and forehead. They open into your nasal cavity. To do a sinus rinse, you will need:  Saline solution.  Neti pot or spray bottle. This releases the saline solution into your nose and through your sinuses. You can buy neti pots and spray bottles at: ? Char[employer] fund[employer]. ? A health food store. ? Online.  When should I do a sinus rinse? A sinus rinse can help to clear your nasal cavity. It can clear:  Mucus.  Dirt.  Dust.  Pollen.  You may do a sinus rinse when you have:  A cold.  A virus.  Allergies.  A sinus infection.  A stuffy nose.  If you are considering a sinus rinse:  Ask your child's doctor before doing a sinus rinse on your child.  Do not do a sinus rinse if you have had: ? Ear or nasal surgery. ? An ear infection. ? Blocked ears. How do I do a sinus rinse?  Wash your hands.  Disinfect your device using the directions that came with the device.  Dry your device.  Use the solution that comes with your device or one that is sold separately in stores. Follow the mixing directions on the package.  Fill your device with the amount of saline solution as stated in the device instructions.  Stand over a sink and tilt your head sideways over the sink.  Place the spout of the device in your upper nostril (the one closer to the ceiling).  Gently pour or squeeze the saline solution into the nasal cavity. The liquid should drain to the lower nostril if you are not too  congested.  Gently blow your nose. Blowing too hard may cause ear pain.  Repeat in the other nostril.  Clean and rinse your device with clean water.  Air-dry your device. Are there risks of a sinus rinse? Sinus rinse is normally very safe and helpful. However, there are a few risks, which include:  A burning feeling in the sinuses. This may happen if you do not make the saline solution as instructed. Make sure to follow all directions when making the saline solution.  Infection from unclean water. This is rare, but possible.  Nasal irritation.  This information is not intended to replace advice given to you by your health care provider. Make sure you discuss any questions you have with your health care provider. Document Released: 09/17/2013 Document Revised: 01/18/2016 Document Reviewed: 07/08/2013 Elsevier Interactive Patient Education  2017 Elsevier Inc.  Losartan tablets What is this medicine? LOSARTAN (loe SAR tan) is used to treat high blood pressure and to reduce the risk of stroke in certain patients. This drug also slows the progression of kidney disease in patients with diabetes. This medicine may be used for other purposes; ask your health care provider or pharmacist if you have questions. COMMON BRAND NAME(S): Cozaar What should I tell my health care provider before I take this medicine? They need to know if you have any of these conditions: -heart failure -kidney or liver disease -an unusual or allergic reaction to losartan, other medicines, foods, dyes, or preservatives -pregnant or trying to get pregnant -breast-feeding How should I use this medicine? Take this medicine by mouth with a glass of water. Follow the directions on the prescription label. This medicine can be taken with or without food. Take your doses at regular intervals. Do not take your medicine more often than directed. Talk to your pediatrician regarding the use of this medicine in children.  Special care may be needed. Overdosage: If you think you have taken too much of this medicine contact a poison control center or emergency room at once. NOTE: This medicine is only for you. Do not share this medicine with others. What if I miss a dose? If you miss a dose, take it as soon as you can. If it is almost time for your next dose, take only that dose. Do not take double or extra doses. What may interact with this medicine? -blood pressure medicines -diuretics, especially triamterene, spironolactone, or amiloride -fluconazole -NSAIDs, medicines for pain and inflammation, like ibuprofen or naproxen -potassium salts or potassium supplements -rifampin This list may not describe all possible interactions. Give your health care provider a list of all the medicines, herbs, non-prescription drugs, or dietary  supplements you use. Also tell them if you smoke, drink alcohol, or use illegal drugs. Some items may interact with your medicine. What should I watch for while using this medicine? Visit your doctor or health care professional for regular checks on your progress. Check your blood pressure as directed. Ask your doctor or health care professional what your blood pressure should be and when you should contact him or her. Call your doctor or health care professional if you notice an irregular or fast heart beat. Women should inform their doctor if they wish to become pregnant or think they might be pregnant. There is a potential for serious side effects to an unborn child, particularly in the second or third trimester. Talk to your health care professional or pharmacist for more information. You may get drowsy or dizzy. Do not drive, use machinery, or do anything that needs mental alertness until you know how this drug affects you. Do not stand or sit up quickly, especially if you are an older patient. This reduces the risk of dizzy or fainting spells. Alcohol can make you more drowsy and dizzy.  Avoid alcoholic drinks. Avoid salt substitutes unless you are told otherwise by your doctor or health care professional. Do not treat yourself for coughs, colds, or pain while you are taking this medicine without asking your doctor or health care professional for advice. Some ingredients may increase your blood pressure. What side effects may I notice from receiving this medicine? Side effects that you should report to your doctor or health care professional as soon as possible: -confusion, dizziness, light headedness or fainting spells -decreased amount of urine passed -difficulty breathing or swallowing, hoarseness, or tightening of the throat -fast or irregular heart beat, palpitations, or chest pain -skin rash, itching -swelling of your face, lips, tongue, hands, or feet Side effects that usually do not require medical attention (report to your doctor or health care professional if they continue or are bothersome): -cough -decreased sexual function or desire -headache -nasal congestion or stuffiness -nausea or stomach pain -sore or cramping muscles This list may not describe all possible side effects. Call your doctor for medical advice about side effects. You may report side effects to FDA at 1-800-FDA-1088. Where should I keep my medicine? Keep out of the reach of children. Store at room temperature between 15 and 30 degrees C (59 and 86 degrees F). Protect from light. Keep container tightly closed. Throw away any unused medicine after the expiration date. NOTE: This sheet is a summary. It may not cover all possible information. If you have questions about this medicine, talk to your doctor, pharmacist, or health care provider.  2018 Elsevier/Gold Standard (2007-05-03 16:42:18)

## 2017-11-24 LAB — COMPREHENSIVE METABOLIC PANEL
ALT: 57 IU/L — ABNORMAL HIGH (ref 0–44)
AST: 47 IU/L — ABNORMAL HIGH (ref 0–40)
Albumin/Globulin Ratio: 1.8 (ref 1.2–2.2)
Albumin: 4.8 g/dL (ref 3.5–5.5)
Alkaline Phosphatase: 59 IU/L (ref 39–117)
BUN/Creatinine Ratio: 11 (ref 9–20)
BUN: 13 mg/dL (ref 6–20)
Bilirubin Total: 0.3 mg/dL (ref 0.0–1.2)
CO2: 23 mmol/L (ref 20–29)
Calcium: 9.9 mg/dL (ref 8.7–10.2)
Chloride: 96 mmol/L (ref 96–106)
Creatinine, Ser: 1.16 mg/dL (ref 0.76–1.27)
GFR calc Af Amer: 95 mL/min/{1.73_m2} (ref >59)
GFR calc non Af Amer: 82 mL/min/{1.73_m2} (ref >59)
Globulin, Total: 2.6 g/dL (ref 1.5–4.5)
Glucose: 88 mg/dL (ref 65–99)
Potassium: 3.8 mmol/L (ref 3.5–5.2)
Sodium: 135 mmol/L (ref 134–144)
Total Protein: 7.4 g/dL (ref 6.0–8.5)

## 2017-12-04 HISTORY — PX: FRACTURE SURGERY: SHX138

## 2017-12-07 ENCOUNTER — Ambulatory Visit: Payer: 59

## 2017-12-07 ENCOUNTER — Other Ambulatory Visit: Payer: Self-pay | Admitting: Family Medicine

## 2017-12-07 VITALS — BP 122/72

## 2017-12-07 DIAGNOSIS — I10 Essential (primary) hypertension: Secondary | ICD-10-CM

## 2017-12-07 MED ORDER — LOSARTAN POTASSIUM 50 MG PO TABS: 50.0000 mg | ORAL_TABLET | Freq: Two times a day (BID) | ORAL | 1 refills | Status: DC

## 2017-12-07 NOTE — Progress Notes (Signed)
Patient is taking 1 tab po BID. With success.

## 2017-12-08 ENCOUNTER — Emergency Department (HOSPITAL_COMMUNITY): Payer: PRIVATE HEALTH INSURANCE | Admitting: Certified Registered"

## 2017-12-08 ENCOUNTER — Observation Stay (HOSPITAL_COMMUNITY)
Admission: EM | Admit: 2017-12-08 | Discharge: 2017-12-09 | Disposition: A | Discharge status: Home / Self Care | Payer: PRIVATE HEALTH INSURANCE | Attending: Orthopedic Surgery | Admitting: Orthopedic Surgery

## 2017-12-08 ENCOUNTER — Encounter (HOSPITAL_COMMUNITY): Payer: Self-pay | Admitting: Emergency Medicine

## 2017-12-08 ENCOUNTER — Emergency Department (HOSPITAL_COMMUNITY): Payer: PRIVATE HEALTH INSURANCE

## 2017-12-08 ENCOUNTER — Encounter (HOSPITAL_COMMUNITY)
Admission: EM | Disposition: A | Discharge status: Home / Self Care | Payer: Self-pay | Source: Home / Self Care | Attending: Orthopedic Surgery

## 2017-12-08 ENCOUNTER — Other Ambulatory Visit: Payer: Self-pay

## 2017-12-08 DIAGNOSIS — S62612B Displaced fracture of proximal phalanx of right middle finger, initial encounter for open fracture: Principal | ICD-10-CM | POA: Insufficient documentation

## 2017-12-08 DIAGNOSIS — S6291XA Unspecified fracture of right wrist and hand, initial encounter for closed fracture: Secondary | ICD-10-CM | POA: Diagnosis present

## 2017-12-08 DIAGNOSIS — E785 Hyperlipidemia, unspecified: Secondary | ICD-10-CM | POA: Insufficient documentation

## 2017-12-08 DIAGNOSIS — Z87891 Personal history of nicotine dependence: Secondary | ICD-10-CM | POA: Diagnosis not present

## 2017-12-08 DIAGNOSIS — S62614B Displaced fracture of proximal phalanx of right ring finger, initial encounter for open fracture: Secondary | ICD-10-CM | POA: Insufficient documentation

## 2017-12-08 DIAGNOSIS — S61209A Unspecified open wound of unspecified finger without damage to nail, initial encounter: Secondary | ICD-10-CM | POA: Diagnosis present

## 2017-12-08 DIAGNOSIS — S63259A Unspecified dislocation of unspecified finger, initial encounter: Secondary | ICD-10-CM

## 2017-12-08 DIAGNOSIS — I10 Essential (primary) hypertension: Secondary | ICD-10-CM | POA: Diagnosis not present

## 2017-12-08 DIAGNOSIS — S62616B Displaced fracture of proximal phalanx of right little finger, initial encounter for open fracture: Secondary | ICD-10-CM | POA: Insufficient documentation

## 2017-12-08 DIAGNOSIS — Z880 Allergy status to penicillin: Secondary | ICD-10-CM | POA: Insufficient documentation

## 2017-12-08 DIAGNOSIS — Y9359 Activity, other involving other sports and athletics played individually: Secondary | ICD-10-CM | POA: Insufficient documentation

## 2017-12-08 DIAGNOSIS — Z79899 Other long term (current) drug therapy: Secondary | ICD-10-CM | POA: Insufficient documentation

## 2017-12-08 HISTORY — PX: I & D EXTREMITY: SHX5045

## 2017-12-08 HISTORY — DX: Essential (primary) hypertension: I10

## 2017-12-08 LAB — CBC WITH DIFFERENTIAL/PLATELET
ABS IMMATURE GRANULOCYTES: 0 10*3/uL (ref 0.0–0.1)
Basophils Absolute: 0.1 10*3/uL (ref 0.0–0.1)
Basophils Relative: 1 %
Eosinophils Absolute: 0 10*3/uL (ref 0.0–0.7)
Eosinophils Relative: 1 %
HCT: 40 % (ref 39.0–52.0)
HEMOGLOBIN: 13.3 g/dL (ref 13.0–17.0)
Immature Granulocytes: 0 %
LYMPHS PCT: 27 %
Lymphs Abs: 1.6 10*3/uL (ref 0.7–4.0)
MCH: 30.9 pg (ref 26.0–34.0)
MCHC: 33.3 g/dL (ref 30.0–36.0)
MCV: 93 fL (ref 78.0–100.0)
MONO ABS: 0.6 10*3/uL (ref 0.1–1.0)
MONOS PCT: 9 %
NEUTROS ABS: 3.9 10*3/uL (ref 1.7–7.7)
Neutrophils Relative %: 62 %
Platelets: 244 10*3/uL (ref 150–400)
RBC: 4.3 MIL/uL (ref 4.22–5.81)
RDW: 12.8 % (ref 11.5–15.5)
WBC: 6.2 10*3/uL (ref 4.0–10.5)

## 2017-12-08 LAB — BASIC METABOLIC PANEL
Anion gap: 14 (ref 5–15)
BUN: 11 mg/dL (ref 6–20)
CHLORIDE: 104 mmol/L (ref 98–111)
CO2: 22 mmol/L (ref 22–32)
Calcium: 9.2 mg/dL (ref 8.9–10.3)
Creatinine, Ser: 1.06 mg/dL (ref 0.61–1.24)
GFR calc Af Amer: >60 mL/min (ref >60)
GFR calc non Af Amer: >60 mL/min (ref >60)
GLUCOSE: 105 mg/dL — AB (ref 70–99)
POTASSIUM: 4 mmol/L (ref 3.5–5.1)
Sodium: 140 mmol/L (ref 135–145)

## 2017-12-08 SURGERY — IRRIGATION AND DEBRIDEMENT EXTREMITY
Anesthesia: General | Site: Hand | Laterality: Right

## 2017-12-08 MED ORDER — PROPOFOL 10 MG/ML IV BOLUS
INTRAVENOUS | Status: AC
  Filled 2017-12-08: qty 20

## 2017-12-08 MED ORDER — CLINDAMYCIN PHOSPHATE 600 MG/50ML IV SOLN
600.0000 mg | Freq: Three times a day (TID) | INTRAVENOUS | Status: DC
  Filled 2017-12-08: qty 50

## 2017-12-08 MED ORDER — MIDAZOLAM HCL 2 MG/2ML IJ SOLN
INTRAMUSCULAR | Status: AC
  Filled 2017-12-08: qty 2

## 2017-12-08 MED ORDER — ACETAMINOPHEN 500 MG PO TABS: 1000.0000 mg | ORAL_TABLET | Freq: Once | ORAL | Status: DC | PRN

## 2017-12-08 MED ORDER — OXYCODONE HCL 5 MG PO TABS
5.0000 mg | ORAL_TABLET | ORAL | Status: DC | PRN
  Administered 2017-12-08: 5 mg via ORAL
  Administered 2017-12-08 – 2017-12-09 (×4): 10 mg via ORAL
  Filled 2017-12-08 (×3): qty 2
  Filled 2017-12-08: qty 1
  Filled 2017-12-08: qty 2

## 2017-12-08 MED ORDER — DEXAMETHASONE SODIUM PHOSPHATE 10 MG/ML IJ SOLN
INTRAMUSCULAR | Status: DC | PRN
  Administered 2017-12-08: 10 mg via INTRAVENOUS

## 2017-12-08 MED ORDER — 0.9 % SODIUM CHLORIDE (POUR BTL) OPTIME
TOPICAL | Status: DC | PRN
  Administered 2017-12-08: 1000 mL

## 2017-12-08 MED ORDER — SUCCINYLCHOLINE CHLORIDE 20 MG/ML IJ SOLN
INTRAMUSCULAR | Status: DC | PRN
  Administered 2017-12-08: 90 mg via INTRAVENOUS

## 2017-12-08 MED ORDER — LOSARTAN POTASSIUM 50 MG PO TABS
50.0000 mg | ORAL_TABLET | Freq: Two times a day (BID) | ORAL | Status: DC
  Administered 2017-12-08 – 2017-12-09 (×3): 50 mg via ORAL
  Filled 2017-12-08 (×4): qty 1

## 2017-12-08 MED ORDER — ONDANSETRON HCL 4 MG PO TABS: 4.0000 mg | ORAL_TABLET | Freq: Four times a day (QID) | ORAL | Status: DC | PRN

## 2017-12-08 MED ORDER — OXYCODONE-ACETAMINOPHEN 5-325 MG PO TABS
2.0000 | ORAL_TABLET | Freq: Once | ORAL | Status: AC
  Administered 2017-12-08: 2 via ORAL
  Filled 2017-12-08 (×2): qty 2

## 2017-12-08 MED ORDER — DOCUSATE SODIUM 100 MG PO CAPS
100.0000 mg | ORAL_CAPSULE | Freq: Two times a day (BID) | ORAL | Status: DC
  Administered 2017-12-08 – 2017-12-09 (×3): 100 mg via ORAL
  Filled 2017-12-08 (×3): qty 1

## 2017-12-08 MED ORDER — HYDROMORPHONE HCL 1 MG/ML IJ SOLN: 0.5000 mg | INTRAMUSCULAR | Status: DC | PRN

## 2017-12-08 MED ORDER — VITAMIN C 500 MG PO TABS
1000.0000 mg | ORAL_TABLET | Freq: Every day | ORAL | Status: DC
  Administered 2017-12-08 – 2017-12-09 (×2): 1000 mg via ORAL
  Filled 2017-12-08 (×2): qty 2

## 2017-12-08 MED ORDER — ONDANSETRON HCL 4 MG/2ML IJ SOLN
INTRAMUSCULAR | Status: DC | PRN
  Administered 2017-12-08: 4 mg via INTRAVENOUS

## 2017-12-08 MED ORDER — FENTANYL CITRATE (PF) 250 MCG/5ML IJ SOLN
INTRAMUSCULAR | Status: AC
  Filled 2017-12-08: qty 5

## 2017-12-08 MED ORDER — LIDOCAINE HCL 2 % IJ SOLN
20.0000 mL | Freq: Once | INTRAMUSCULAR | Status: AC
  Administered 2017-12-08: 400 mg
  Filled 2017-12-08: qty 20

## 2017-12-08 MED ORDER — PROPOFOL 10 MG/ML IV BOLUS
INTRAVENOUS | Status: DC | PRN
  Administered 2017-12-08: 30 mg via INTRAVENOUS
  Administered 2017-12-08: 140 mg via INTRAVENOUS
  Administered 2017-12-08: 30 mg via INTRAVENOUS

## 2017-12-08 MED ORDER — SODIUM CHLORIDE 0.9 % IR SOLN
Status: DC | PRN
  Administered 2017-12-08: 3000 mL

## 2017-12-08 MED ORDER — METHOCARBAMOL 500 MG PO TABS
500.0000 mg | ORAL_TABLET | Freq: Four times a day (QID) | ORAL | Status: DC | PRN
  Administered 2017-12-08: 500 mg via ORAL
  Filled 2017-12-08: qty 1

## 2017-12-08 MED ORDER — ACETAMINOPHEN 160 MG/5ML PO SOLN: 1000.0000 mg | Freq: Once | ORAL | Status: DC | PRN

## 2017-12-08 MED ORDER — FENTANYL CITRATE (PF) 100 MCG/2ML IJ SOLN
25.0000 ug | INTRAMUSCULAR | Status: DC | PRN
  Administered 2017-12-08: 50 ug via INTRAVENOUS

## 2017-12-08 MED ORDER — FENTANYL CITRATE (PF) 250 MCG/5ML IJ SOLN
INTRAMUSCULAR | Status: DC | PRN
  Administered 2017-12-08: 100 ug via INTRAVENOUS
  Administered 2017-12-08 (×3): 50 ug via INTRAVENOUS

## 2017-12-08 MED ORDER — METHOCARBAMOL 1000 MG/10ML IJ SOLN
500.0000 mg | Freq: Four times a day (QID) | INTRAVENOUS | Status: DC | PRN
  Filled 2017-12-08: qty 5

## 2017-12-08 MED ORDER — BUPIVACAINE HCL (PF) 0.25 % IJ SOLN: INTRAMUSCULAR | Status: DC | PRN

## 2017-12-08 MED ORDER — FENTANYL CITRATE (PF) 100 MCG/2ML IJ SOLN
INTRAMUSCULAR | Status: AC
  Filled 2017-12-08: qty 2

## 2017-12-08 MED ORDER — ACETAMINOPHEN 10 MG/ML IV SOLN: 1000.0000 mg | Freq: Once | INTRAVENOUS | Status: DC | PRN

## 2017-12-08 MED ORDER — ONDANSETRON HCL 4 MG/2ML IJ SOLN: 4.0000 mg | Freq: Four times a day (QID) | INTRAMUSCULAR | Status: DC | PRN

## 2017-12-08 MED ORDER — PROMETHAZINE HCL 12.5 MG RE SUPP
12.5000 mg | Freq: Four times a day (QID) | RECTAL | Status: DC | PRN
  Filled 2017-12-08: qty 1

## 2017-12-08 MED ORDER — CLINDAMYCIN PHOSPHATE 600 MG/50ML IV SOLN
600.0000 mg | Freq: Three times a day (TID) | INTRAVENOUS | Status: DC
  Administered 2017-12-08 – 2017-12-09 (×3): 600 mg via INTRAVENOUS
  Filled 2017-12-08 (×4): qty 50

## 2017-12-08 MED ORDER — OXYCODONE HCL 5 MG/5ML PO SOLN: 5.0000 mg | Freq: Once | ORAL | Status: DC | PRN

## 2017-12-08 MED ORDER — OXYCODONE HCL 5 MG PO TABS: 5.0000 mg | ORAL_TABLET | Freq: Once | ORAL | Status: DC | PRN

## 2017-12-08 MED ORDER — MIDAZOLAM HCL 5 MG/5ML IJ SOLN
INTRAMUSCULAR | Status: DC | PRN
  Administered 2017-12-08: 2 mg via INTRAVENOUS

## 2017-12-08 MED ORDER — FAMOTIDINE 20 MG PO TABS: 20.0000 mg | ORAL_TABLET | Freq: Two times a day (BID) | ORAL | Status: DC | PRN

## 2017-12-08 MED ORDER — BUPIVACAINE HCL (PF) 0.25 % IJ SOLN
INTRAMUSCULAR | Status: AC
  Filled 2017-12-08: qty 10

## 2017-12-08 MED ORDER — LIDOCAINE HCL (CARDIAC) PF 100 MG/5ML IV SOSY
PREFILLED_SYRINGE | INTRAVENOUS | Status: DC | PRN
  Administered 2017-12-08: 60 mg via INTRATRACHEAL

## 2017-12-08 MED ORDER — CLINDAMYCIN PHOSPHATE 600 MG/50ML IV SOLN
600.0000 mg | Freq: Once | INTRAVENOUS | Status: AC
  Administered 2017-12-08: 600 mg via INTRAVENOUS
  Filled 2017-12-08: qty 50

## 2017-12-08 MED ORDER — FLUTICASONE PROPIONATE 50 MCG/ACT NA SUSP
2.0000 | Freq: Every day | NASAL | Status: DC
  Administered 2017-12-08 – 2017-12-09 (×2): 2 via NASAL
  Filled 2017-12-08: qty 16

## 2017-12-08 MED ORDER — LACTATED RINGERS IV SOLN
INTRAVENOUS | Status: DC
  Administered 2017-12-08: 23:00:00 via INTRAVENOUS

## 2017-12-08 MED ORDER — LACTATED RINGERS IV SOLN
INTRAVENOUS | Status: DC | PRN
  Administered 2017-12-08: 04:00:00 via INTRAVENOUS

## 2017-12-08 SURGICAL SUPPLY — 42 items
BANDAGE ACE 4X5 VEL STRL LF (GAUZE/BANDAGES/DRESSINGS) ×3 IMPLANT
BNDG CONFORM 2 STRL LF (GAUZE/BANDAGES/DRESSINGS) ×3 IMPLANT
BNDG GAUZE ELAST 4 BULKY (GAUZE/BANDAGES/DRESSINGS) ×3 IMPLANT
CORDS BIPOLAR (ELECTRODE) ×3 IMPLANT
COVER SURGICAL LIGHT HANDLE (MISCELLANEOUS) ×3 IMPLANT
COVER WAND RF STERILE (DRAPES) ×3 IMPLANT
CUFF TOURNIQUET SINGLE 18IN (TOURNIQUET CUFF) ×3 IMPLANT
DECANTER SPIKE VIAL GLASS SM (MISCELLANEOUS) ×3 IMPLANT
DRAPE C-ARM MINI 42X72 WSTRAPS (DRAPES) ×3 IMPLANT
DRSG ADAPTIC 3X8 NADH LF (GAUZE/BANDAGES/DRESSINGS) ×3 IMPLANT
DRSG EMULSION OIL 3X3 NADH (GAUZE/BANDAGES/DRESSINGS) ×3 IMPLANT
GAUZE SPONGE 4X4 12PLY STRL (GAUZE/BANDAGES/DRESSINGS) ×3 IMPLANT
GAUZE XEROFORM 1X8 LF (GAUZE/BANDAGES/DRESSINGS) ×3 IMPLANT
GLOVE BIOGEL M 8.0 STRL (GLOVE) ×3 IMPLANT
GLOVE ECLIPSE 7.0 STRL STRAW (GLOVE) ×3 IMPLANT
GLOVE SS BIOGEL STRL SZ 8 (GLOVE) ×1 IMPLANT
GLOVE SUPERSENSE BIOGEL SZ 8 (GLOVE) ×2
GOWN STRL REUS W/ TWL LRG LVL3 (GOWN DISPOSABLE) ×1 IMPLANT
GOWN STRL REUS W/ TWL XL LVL3 (GOWN DISPOSABLE) ×1 IMPLANT
GOWN STRL REUS W/TWL LRG LVL3 (GOWN DISPOSABLE) ×2
GOWN STRL REUS W/TWL XL LVL3 (GOWN DISPOSABLE) ×2
KIT BASIN OR (CUSTOM PROCEDURE TRAY) ×3 IMPLANT
KIT TURNOVER KIT B (KITS) ×3 IMPLANT
MANIFOLD NEPTUNE II (INSTRUMENTS) ×3 IMPLANT
NEEDLE HYPO 25GX1X1/2 BEV (NEEDLE) IMPLANT
NS IRRIG 1000ML POUR BTL (IV SOLUTION) ×3 IMPLANT
PACK ORTHO EXTREMITY (CUSTOM PROCEDURE TRAY) ×3 IMPLANT
PAD ARMBOARD 7.5X6 YLW CONV (MISCELLANEOUS) ×3 IMPLANT
PAD CAST 4YDX4 CTTN HI CHSV (CAST SUPPLIES) ×1 IMPLANT
PADDING CAST COTTON 4X4 STRL (CAST SUPPLIES) ×2
SCRUB BETADINE 4OZ XXX (MISCELLANEOUS) ×3 IMPLANT
SET CYSTO W/LG BORE CLAMP LF (SET/KITS/TRAYS/PACK) ×3 IMPLANT
SOL PREP POV-IOD 4OZ 10% (MISCELLANEOUS) ×3 IMPLANT
SPONGE LAP 4X18 RFD (DISPOSABLE) ×3 IMPLANT
SUT ETHILON 4 0 PS 2 18 (SUTURE) IMPLANT
SUT PROLENE 4 0 PS 2 18 (SUTURE) ×3 IMPLANT
SWAB CULTURE ESWAB REG 1ML (MISCELLANEOUS) IMPLANT
SYR CONTROL 10ML LL (SYRINGE) ×3 IMPLANT
TOWEL OR 17X24 6PK STRL BLUE (TOWEL DISPOSABLE) ×3 IMPLANT
TUBE CONNECTING 12'X1/4 (SUCTIONS) ×1
TUBE CONNECTING 12X1/4 (SUCTIONS) ×2 IMPLANT
YANKAUER SUCT BULB TIP NO VENT (SUCTIONS) ×3 IMPLANT

## 2017-12-08 NOTE — ED Notes (Signed)
Dr. Amanda Pea rolled pt to OR on stretcher.  Aware of no IV access at this time.  Lyla Son, RN given report.  Antibiotic sent up with pt.

## 2017-12-08 NOTE — Transfer of Care (Signed)
Immediate Anesthesia Transfer of Care Note  Patient: Daniel Kelley  Procedure(s) Performed: IRRIGATION AND DEBRIDEMENT EXTREMITY, RIGHT HAND (Right Hand)  Patient Location: PACU  Anesthesia Type:General  Level of Consciousness: responds to stimulation  Airway & Oxygen Therapy: Patient Spontanous Breathing and Patient connected to nasal cannula oxygen  Post-op Assessment: Report given to RN and Post -op Vital signs reviewed and stable  Post vital signs: Reviewed and stable  Last Vitals:  Vitals Value Taken Time  BP 132/71 12/08/2017  5:42 AM  Temp    Pulse 112 12/08/2017  5:44 AM  Resp 25 12/08/2017  5:44 AM  SpO2 96 % 12/08/2017  5:44 AM  Vitals shown include unvalidated device data.  Last Pain:  Vitals:   12/08/17 0242  TempSrc:   PainSc: 10-Worst pain ever         Complications: No apparent anesthesia complications

## 2017-12-08 NOTE — ED Notes (Signed)
Pt stopped this EMT in lobby requesting to see a doctor immediately and expressed significant frustration with having to wait. This EMT reassured pt we would get him back as soon as we have rooms available. Pt stated he understood. Pt thanked for his patience as we work to get a room for him to be seen.

## 2017-12-08 NOTE — ED Triage Notes (Addendum)
Pt fell off a Hover board approx 30 min ago onto concrete.  Denies LOC.  Denies neck and back pain.  C/o large lacerations across R anterior 3rd and 4th digits.  + deformity with tendon exposure.

## 2017-12-08 NOTE — ED Provider Notes (Signed)
[facility] EMERGENCY DEPARTMENT Provider Note   CSN: [id number] Arrival date & time: 12/08/17  0057     History   Chief Complaint Chief Complaint  Patient presents with  . Fall  . Hand Injury    HPI Daniel Kelley is a 32 y.o. male.  Patient follow-up of a hover board onto concrete underwent outstretched right hand and sustained a hyperextension injury to the third and fourth and fifth digits of the right hand.  Denies hitting head or losing consciousness.  Denies any neck or back pain.  No chest pain or shortness of breath.  Does not take any blood thinners.  Has sensation intact to his fingers but decreased movement.  Bleeding is controlled.  The history is provided by the patient.  Fall  Pertinent negatives include no chest pain, no abdominal pain, no headaches and no shortness of breath.  Hand Injury   Pertinent negatives include no fever.    Past Medical History:  Diagnosis Date  . Hypertension   . Tonsillitis     Patient Active Problem List   Diagnosis Date Noted  . Essential hypertension 08/23/2017  . Hyperlipidemia LDL goal <100 08/23/2017    Past Surgical History:  Procedure Laterality Date  . NO PAST SURGERIES    . TONSILLECTOMY N/A 10/01/2015   Procedure: TONSILLECTOMY;  Surgeon: Drema Halon, MD;  Location: [facility];  Service: ENT;  Laterality: N/A;        Home Medications    Prior to Admission medications   Medication Sig Start Date End Date Taking? Authorizing Provider  fluticasone (FLONASE) 50 MCG/ACT nasal spray Place 2 sprays into both nostrils daily. 11/23/17  Yes Mike Gip, FNP  losartan (COZAAR) 50 MG tablet Take 1 tablet (50 mg total) by mouth 2 (two) times daily. 12/07/17  Yes Mike Gip, FNP    Family History No family history on file.  Social History Social History   Tobacco Use  . Smoking status: Former Smoker    Types: Cigarettes  . Smokeless tobacco: Former Engineer, water  Use Topics  . Alcohol use: Yes    Comment: couple times weekly   . Drug use: No     Allergies   Shrimp [shellfish allergy] and Penicillins   Review of Systems Review of Systems  Constitutional: Negative for activity change, appetite change and fever.  HENT: Negative for congestion and rhinorrhea.   Eyes: Negative for visual disturbance.  Respiratory: Negative for chest tightness and shortness of breath.   Cardiovascular: Negative for chest pain and palpitations.  Gastrointestinal: Negative for abdominal pain, nausea and vomiting.  Genitourinary: Negative for dysuria and testicular pain.  Musculoskeletal: Positive for arthralgias and myalgias.  Skin: Positive for wound.  Neurological: Negative for dizziness, weakness and headaches.    all other systems are negative except as noted in the HPI and PMH.    Physical Exam Updated Vital Signs BP 130/88 (BP Location: Left Arm)   Pulse 82   Temp 98.5 F (36.9 C) (Oral)   Resp 18   SpO2 100%   Physical Exam  Constitutional: He is oriented to person, place, and time. He appears well-developed and well-nourished. No distress.  HENT:  Head: Normocephalic and atraumatic.  Mouth/Throat: Oropharynx is clear and moist. No oropharyngeal exudate.  Eyes: Pupils are equal, round, and reactive to light. Conjunctivae and EOM are normal.  Neck: Normal range of motion. Neck supple.  No meningismus.  Cardiovascular: Normal rate, regular rhythm, normal heart sounds  and intact distal pulses.  No murmur heard. Pulmonary/Chest: Effort normal and breath sounds normal. No respiratory distress. He exhibits no tenderness.  Abdominal: Soft. There is no tenderness. There is no rebound and no guarding.  Musculoskeletal: Normal range of motion. He exhibits edema and tenderness.  There are lacerations on the palmar surface of the PIP joints of the third and fourth digits on the right hand.  Tendon is exposed. Distal sensation and capillary refill are  intact. Radial pulses intact. Dislocation dorsally of fourth and fifth PIP joints.  Patient unable to flex DIP or PIP joints of fourth and fifth digits Pain with range of motion of DIP and PIP joints of the third digit  Neurological: He is alert and oriented to person, place, and time. No cranial nerve deficit. He exhibits normal muscle tone. Coordination normal.  No ataxia on finger to nose bilaterally. No pronator drift. 5/5 strength throughout. CN 2-12 intact.Equal grip strength. Sensation intact.   Skin: Skin is warm. Capillary refill takes less than 2 seconds. No rash noted.  Psychiatric: He has a normal mood and affect. His behavior is normal.  Nursing note and vitals reviewed.    ED Treatments / Results  Labs (all labs ordered are listed, but only abnormal results are displayed) Labs Reviewed  BASIC METABOLIC PANEL - Abnormal; Notable for the following components:      Result Value   Glucose, Bld 105 (*)    All other components within normal limits  CBC WITH DIFFERENTIAL/PLATELET    EKG None  Radiology Dg Hand Complete Right  Result Date: 12/08/2017 CLINICAL DATA:  Fall off hover board, laceration to third and fourth digit. EXAM: RIGHT HAND - COMPLETE 3+ VIEW COMPARISON:  None. FINDINGS: There is radial subluxation of the fourth digit at the proximal interphalangeal joint, not well assessed on the lateral view due to osseous overlap. Dorsal dislocation of the fifth digit at the proximal interphalangeal joint. Third digit laceration at the proximal interphalangeal joint with skin defect. No radiopaque foreign body. No fractures of the digits or hand. IMPRESSION: 1. Dorsal dislocation of the fifth digit at the proximal interphalangeal joint. Radial subluxation of the fourth digit at the proximal interphalangeal joint, not well assessed on the lateral view due to osseous overlap. 2. Third digit laceration without radiopaque foreign body. The fourth digit laceration is not well seen  radiographically. Electronically Signed   By: Narda Rutherford M.D.   On: 12/08/2017 01:47    Procedures Procedures (including critical care time)  Medications Ordered in ED Medications  lidocaine (XYLOCAINE) 2 % (with pres) injection 400 mg (has no administration in time range)  oxyCODONE-acetaminophen (PERCOCET/ROXICET) 5-325 MG per tablet 2 tablet (has no administration in time range)     Initial Impression / Assessment and Plan / ED Course  I have reviewed the triage vital signs and the nursing notes.  Pertinent labs & imaging results that were available during my care of the patient were reviewed by me and considered in my medical decision making (see chart for details).    Patient with fall and hyperextension injury of digits of right hand.  Lacerations overlying PIP joints on the palmar surface. No other injuries. No head, neck, back, chest, abdominal pain.  No fractures on Xrays. PIP dislocations seen. Patient with open PIP dislocation of 4th digit with tendon exposed .  Closed PIP dislocation of fifth digit.  Laceration overlying PIP joint of third digit with tendon exposed.  IV ancef given. Tetanus up to  date.  D/w Dr. Amanda Pea who feels patient would be best served in the OR and he will take there emergently. He requests to evaluate patient before any attempt at reduction.  Patient to OR with Dr. Amanda Pea.  Final Clinical Impressions(s) / ED Diagnoses   Final diagnoses:  Open dislocation of finger of right hand, initial encounter    ED Discharge Orders    None       Brian Zeitlin, Jeannett Senior, MD 12/08/17 [phone]

## 2017-12-08 NOTE — Op Note (Signed)
See dictation#[id number] SP open Tx PIP dislocations right MF AND RF with I&D, SF closed red PIP Admit for IV ABX and obs Jahniya Duzan MD

## 2017-12-08 NOTE — Anesthesia Procedure Notes (Signed)
Procedure Name: Intubation Date/Time: 12/08/2017 4:14 AM Performed by: Babs Bertin, CRNA Pre-anesthesia Checklist: Patient identified, Emergency Drugs available, Suction available and Patient being monitored Patient Re-evaluated:Patient Re-evaluated prior to induction Oxygen Delivery Method: Circle System Utilized Preoxygenation: Pre-oxygenation with 100% oxygen Induction Type: IV induction and Rapid sequence Laryngoscope Size: Glidescope (unable to view cords with MAC blade, grade one view with glidescope) Grade View: Grade I Tube type: Oral Tube size: 7.5 mm Number of attempts: 1 Airway Equipment and Method: Stylet and Oral airway Placement Confirmation: ETT inserted through vocal cords under direct vision,  positive ETCO2 and breath sounds checked- equal and bilateral Secured at: 22 cm Tube secured with: Tape Dental Injury: Teeth and Oropharynx as per pre-operative assessment

## 2017-12-08 NOTE — ED Notes (Addendum)
NEXT for treatment room

## 2017-12-08 NOTE — Op Note (Signed)
Daniel Kelley, [facility] ZO:[id number] ACCOUNT [id number] DATE OF BIRTH:[birth year] FACILITY: [facility] LOCATION: [facility] PHYSICIAN:Aaira Oestreicher M. Patrick Salemi, MD  OPERATIVE REPORT  DATE OF PROCEDURE:  12/08/2017  PREOPERATIVE DIAGNOSIS:  Right hand proximal interphalangeal dislocations about the middle finger, ring finger and small finger with open lacerations about the middle and ring finger and soft tissue injury.  POSTOPERATIVE DIAGNOSIS:  Right hand proximal interphalangeal dislocations about the middle finger, ring finger and small finger with open lacerations about the middle and ring finger and soft tissue injury.  PROCEDURE: 1.  Irrigation and debridement of skin, subcutaneous tissue, tendon, volar plate and PIP joint, right middle finger.  This was an excisional debridement 2 cm in nature. 2.  Open treatment PIP dislocation, right middle finger. 3.  Neurolysis, right middle finger radial and ulnar digital nerves with exploration. 4.  Irrigation and debridement of skin, subcutaneous tissue, tendon, volar plate and open PIP joint, right ring finger 2 cm in nature.  This was an excisional debridement with curette, knife and scissor. 5.  Open treatment PIP dislocation, right ring finger. 6.  Radial and ulnar digital nerve neurolysis under 4.5 loupe magnification, right ring finger. 7.  Closed reduction right small finger PIP dislocation. 8.  Five view radiographic series performed examined and interpreted by myself.  SURGEON:  Dominica Severin, MD  ASSISTANT:  None.  COMPLICATIONS:  None.  ANESTHESIA:  General.  TOURNIQUET TIME:  Less than 30 minutes.  INDICATIONS:  A 32 year old male status post hover board accident with open PIP fracture dislocation type processes.  He has avulsion of the volar plate about the middle, ring and small finger.  Small fingers closed.  Ring and middle finger open with  soft tissue disarray.  He understands risks and benefits of surgery and desires to  proceed with the above-mentioned operative intervention.  DESCRIPTION OF PROCEDURE:  The patient was seen by myself and anesthesia and taken to the operative theater and underwent smooth induction of general anesthetic.  Two separate Hibiclens scrubs were applied followed by a 10-minute surgical Betadine scrub.   A timeout was observed.  Clindamycin given secondary to a penicillin allergy and following this, the operation commenced.  After timeout with irrigation and debridement of skin, subcutaneous tissue, tendon, volar plate and open PIP joint about the  middle finger, followed by similar excisional debridement of skin and subcutaneous tissue, volar plate tendon and open PIP joint, right ring finger.  This was an excisional debridement of a 2 cm region.  The small finger was closed.  Three liters of saline were placed through and through the wound with cysto tubing to copiously irrigate the area.  Following this, I then performed a radial and ulnar digital nerve neurolysis under 4.5 loupe magnification about the middle finger and made sure the joint was gently reduced.  It was reduced without difficulty and there were no complicating features.  Following this, we then very carefully and cautiously performed a reduction of the ring finger PIP joint followed by radial and ulnar digital nerve neurolysis under 4.5 loupe magnification.  The joints reduced nicely.  Following this, the small finger  closed, PIP dislocation was reduced.  Thus, PIP dislocations of the middle, ring and small finger were reduced.  He had injuries to the volar plates.  I did not repair the volar plates as he reduced nicely and with prevention of hyperextension should do well.  His nerves were intact.  There  were no complicating features.  Five view stress radiography was  brought in and revealed excellent position of the PIP joints.    The wounds were sutured with Prolene  of the 4-0 variety about the volar aspects of the  ring and middle finger.  A sterile dressing of Adaptic, Xeroform gauze, 4 x 4, Kerlix, Webril, and a dorsal blocking splint was placed.  We will encourage active  flexion and extension within the confines of the splint.  I will have him perform therapeutic endeavors to prevent hyperextension of the area and asked him to notify me should any problems occur.  These notes have been discussed and all questions have been encouraged and answered.  We will keep him in the  hospital 24 hours for IV antibiotics given the open nature of the injuries, etc.  With this in mind, we will proceed accordingly.  All questions have been encouraged and answered.  Once again, this was open PIP dislocations of the middle and ring finger with intact neurovascular structures and lacerations as noted.  Small finger had a closed PIP injury and this was treated with closed reduction measures.  TN/NUANCE  D:12/08/2017 T:12/08/2017 JOB:[id number]

## 2017-12-08 NOTE — ED Notes (Signed)
The pt has lacerations to the middle and ring fingers with sl deformity on the palmar surface of both fingers  ?? Tendon exposure. Ice pack given to the pt

## 2017-12-08 NOTE — Progress Notes (Signed)
Pharmacy Antibiotic Note  Elisandro Meras is a 32 y.o. male admitted on 12/08/2017 with surgical prophylaxis.  Pharmacy has been consulted for clindamycin dosing.  Plan: Clindamycin 600 mg IV q8 hours No need for dose adjsutment Pharmacy will sign off.  Please reconsult if needed   Temp (24hrs), Avg:98.4 F (36.9 C), Min:98 F (36.7 C), Max:98.7 F (37.1 C)  Recent Labs  Lab 12/08/17 0338  WBC 6.2  CREATININE 1.06    CrCl cannot be calculated (Unknown ideal weight.).    Allergies  Allergen Reactions  . Shrimp [Shellfish Allergy] Hives, Itching, Swelling and Rash    Not anaphalaxis  . Penicillins      Thank you for allowing pharmacy to be a part of this patient's care.  Talbert Cage Poteet 12/08/2017 6:58 AM

## 2017-12-08 NOTE — H&P (Addendum)
Shriyans Homann is an 32 y.o. male.   Chief Complaint: Right hand injury with open dislocations HPI: 32 year old male presents after a fall from a hover board with open PIP dislocations to the middle and ring finger as well as injury to the small finger with closed dislocation.  He denies other injury.  He denies neck back chest or abdominal pain.  He has no lower extremity pain complaints.  He is alert and oriented.  I was called by the emergency room after 3 AM for evaluation and treatment.  He was immediately seen consented and we will plan for surgical intervention  Past Medical History:  Diagnosis Date  . Hypertension   . Tonsillitis     Past Surgical History:  Procedure Laterality Date  . NO PAST SURGERIES    . TONSILLECTOMY N/A 10/01/2015   Procedure: TONSILLECTOMY;  Surgeon: Drema Halon, MD;  Location: [facility];  Service: ENT;  Laterality: N/A;    No family history on file. Social History:  reports that he has quit smoking. His smoking use included cigarettes. He has quit using smokeless tobacco. He reports that he drinks alcohol. He reports that he does not use drugs.  Allergies:  Allergies  Allergen Reactions  . Shrimp [Shellfish Allergy] Hives, Itching, Swelling and Rash    Not anaphalaxis  . Penicillins      (Not in a hospital admission)  No results found for this or any previous visit (from the past 48 hour(s)). Dg Hand Complete Right  Result Date: 12/08/2017 CLINICAL DATA:  Fall off hover board, laceration to third and fourth digit. EXAM: RIGHT HAND - COMPLETE 3+ VIEW COMPARISON:  None. FINDINGS: There is radial subluxation of the fourth digit at the proximal interphalangeal joint, not well assessed on the lateral view due to osseous overlap. Dorsal dislocation of the fifth digit at the proximal interphalangeal joint. Third digit laceration at the proximal interphalangeal joint with skin defect. No radiopaque foreign body. No fractures of the  digits or hand. IMPRESSION: 1. Dorsal dislocation of the fifth digit at the proximal interphalangeal joint. Radial subluxation of the fourth digit at the proximal interphalangeal joint, not well assessed on the lateral view due to osseous overlap. 2. Third digit laceration without radiopaque foreign body. The fourth digit laceration is not well seen radiographically. Electronically Signed   By: Narda Rutherford M.D.   On: 12/08/2017 01:47    Review of Systems  Respiratory: Negative.   Cardiovascular: Negative.   Gastrointestinal: Negative.   Genitourinary: Negative.     Blood pressure (!) 128/99, pulse 82, temperature 98.5 F (36.9 C), temperature source Oral, resp. rate 18, SpO2 100 %. Physical Exam  Open PIP fracture dislocations ring and middle finger right hand with transverse lacerations over the PIP joint noting exposed tendons.  No evidence of vascular compromise.  He is sensate.  Patient and I reviewed this at length. Small finger with noted PIP closed dislocation Other extremities are stable.  He has laceration to the small finger without dislocation it appears.  Hand is very tender as expected.  Wrist is stable.  The patient is alert and oriented in no acute distress. The patient complains of pain in the affected upper extremity.  The patient is noted to have a normal HEENT exam. Lung fields show equal chest expansion and no shortness of breath. Abdomen exam is nontender without distention. Lower extremity examination does not show any fracture dislocation or blood clot symptoms. Pelvis is stable and the neck  and back are stable and nontender. Assessment/Plan Open PIP fracture dislocations to the middle and ring finger with open lacerations over the affected joints.  Closed dislocation  about the small finger notable.  We will plan for surgical irrigation debridement repair is necessary.  I discussed with patient the risk and benefit profile and other issues.  We are planning  surgery for your upper extremity. The risk and benefits of surgery to include risk of bleeding, infection, anesthesia,  damage to normal structures and failure of the surgery to accomplish its intended goals of relieving symptoms and restoring function have been discussed in detail. With this in mind we plan to proceed. I have specifically discussed with the patient the pre-and postoperative regime and the dos and don'ts and risk and benefits in great detail. Risk and benefits of surgery also include risk of dystrophy(CRPS), chronic nerve pain, failure of the healing process to go onto completion and other inherent risks of surgery The relavent the pathophysiology of the disease/injury process, as well as the alternatives for treatment and postoperative course of action has been discussed in great detail with the patient who desires to proceed.  We will do everything in our power to help you (the patient) restore function to the upper extremity. It is a pleasure to see this patient today.   Oletta Cohn III, MD 12/08/2017, 3:52 AM

## 2017-12-08 NOTE — Progress Notes (Signed)
Pt arrived to the unit. Vital signs WDL. Right hand dressing is clean, dry, intact. Pt is able to move his fingers; has decreased sensation. Nursing will continue to monitor.

## 2017-12-08 NOTE — Anesthesia Preprocedure Evaluation (Addendum)
Anesthesia Evaluation  Patient identified by MRN, date of birth, ID band Patient awake    Reviewed: Allergy & Precautions, NPO status , Patient's Chart, lab work & pertinent test results  History of Anesthesia Complications Negative for: history of anesthetic complications  Airway Mallampati: III  TM Distance: >3 FB Neck ROM: Full    Dental  (+) Teeth Intact, Chipped,    Pulmonary former smoker,    breath sounds clear to auscultation       Cardiovascular hypertension, Pt. on medications  Rhythm:Regular     Neuro/Psych negative neurological ROS  negative psych ROS   GI/Hepatic negative GI ROS, Neg liver ROS,   Endo/Other  Morbid obesity  Renal/GU negative Renal ROS     Musculoskeletal negative musculoskeletal ROS (+)   Abdominal   Peds  Hematology negative hematology ROS (+)   Anesthesia Other Findings   Reproductive/Obstetrics                             Anesthesia Physical Anesthesia Plan  ASA: II  Anesthesia Plan: General   Post-op Pain Management:    Induction: Intravenous and Rapid sequence  PONV Risk Score and Plan: 2 and Ondansetron and Dexamethasone  Airway Management Planned: Oral ETT  Additional Equipment: None  Intra-op Plan:   Post-operative Plan: Extubation in OR  Informed Consent: I have reviewed the patients History and Physical, chart, labs and discussed the procedure including the risks, benefits and alternatives for the proposed anesthesia with the patient or authorized representative who has indicated his/her understanding and acceptance.   Dental advisory given  Plan Discussed with: CRNA and Surgeon  Anesthesia Plan Comments:        Anesthesia Quick Evaluation

## 2017-12-08 NOTE — Evaluation (Signed)
Physical Therapy Evaluation Patient Details Name: Daniel Kelley MRN: [id number] DOB: 19-May-1985 Today's Date: 12/08/2017   History of Present Illness  Pt is a 32 y/o male admitted after sustaining an injury to his R hand after falling from a hover board at home. Pt is s/p open Tx PIP dislocations right MF AND RF with I&D, SF closed red PIP. PMH including but not limited to HTN.   Clinical Impression  Pt presented supine in bed with HOB elevated, awake and willing to participate in therapy session. Prior to admission, pt reported that he was independent with all functional mobility and ADLs. Pt is currently modified independent to supervision level for all functional mobility. Therapist discussed the importance of ice and elevation in edema control. No further acute PT needs identified at this time. PT signing off.     Follow Up Recommendations No PT follow up    Equipment Recommendations  None recommended by PT    Recommendations for Other Services       Precautions / Restrictions Precautions Precautions: Fall Restrictions Weight Bearing Restrictions: Yes RLE Weight Bearing: Non weight bearing      Mobility  Bed Mobility Overal bed mobility: Modified Independent                Transfers Overall transfer level: Modified independent Equipment used: None                Ambulation/Gait Ambulation/Gait assistance: Supervision Gait Distance (Feet): 200 Feet Assistive device: IV Pole Gait Pattern/deviations: Step-through pattern Gait velocity: decreased   General Gait Details: no instability or LOB, no need for physical assistance, supervision for safety   Stairs Stairs: Yes Stairs assistance: Supervision Stair Management: One rail Left;Alternating pattern;Forwards Number of Stairs: 3 General stair comments: no issues, no LOB or need for assistance, supervision for safety  Wheelchair Mobility    Modified Rankin (Stroke Patients Only)       Balance  Overall balance assessment: Needs assistance Sitting-balance support: Feet supported Sitting balance-Leahy Scale: Good     Standing balance support: No upper extremity supported Standing balance-Leahy Scale: Good                               Pertinent Vitals/Pain Pain Assessment: Faces Faces Pain Scale: Hurts little more Pain Location: R hand Pain Descriptors / Indicators: Sore Pain Intervention(s): Monitored during session;Repositioned    Home Living Family/patient expects to be discharged to:: Private residence Living Arrangements: Spouse/significant other Available Help at Discharge: Family;Available PRN/intermittently Type of Home: House Home Access: Level entry     Home Layout: Two level;Bed/bath upstairs Home Equipment: None      Prior Function Level of Independence: Independent               Hand Dominance   Dominant Hand: Right    Extremity/Trunk Assessment   Upper Extremity Assessment Upper Extremity Assessment: Overall WFL for tasks assessed;RUE deficits/detail RUE Deficits / Details: splint in place at forearm and wrist; pt able to flex/extend fingers through partial ROM RUE: Unable to fully assess due to immobilization    Lower Extremity Assessment Lower Extremity Assessment: Overall WFL for tasks assessed    Cervical / Trunk Assessment Cervical / Trunk Assessment: Normal  Communication   Communication: No difficulties  Cognition Arousal/Alertness: Awake/alert Behavior During Therapy: WFL for tasks assessed/performed Overall Cognitive Status: Within Functional Limits for tasks assessed  General Comments      Exercises     Assessment/Plan    PT Assessment Patent does not need any further PT services  PT Problem List         PT Treatment Interventions      PT Goals (Current goals can be found in the Care Plan section)  Acute Rehab PT Goals Patient Stated Goal:  return home    Frequency     Barriers to discharge        Co-evaluation               AM-PAC PT "6 Clicks" Daily Activity  Outcome Measure Difficulty turning over in bed (including adjusting bedclothes, sheets and blankets)?: None Difficulty moving from lying on back to sitting on the side of the bed? : None Difficulty sitting down on and standing up from a chair with arms (e.g., wheelchair, bedside commode, etc,.)?: None Help needed moving to and from a bed to chair (including a wheelchair)?: None Help needed walking in hospital room?: None Help needed climbing 3-5 steps with a railing? : None 6 Click Score: 24    End of Session   Activity Tolerance: Patient tolerated treatment well Patient left: in bed;with call bell/phone within reach Nurse Communication: Mobility status PT Visit Diagnosis: Pain Pain - Right/Left: Right Pain - part of body: Hand;Arm    Time: 1420-1446 PT Time Calculation (min) (ACUTE ONLY): 26 min   Charges:   PT Evaluation $PT Eval Low Complexity: 1 Low PT Treatments $Gait Training: 8-22 mins        Deborah Chalk, PT, DPT  Acute Rehabilitation Services Pager [phone] Office [phone]    Alessandra Bevels Kobi Mario 12/08/2017, 5:07 PM

## 2017-12-09 DIAGNOSIS — S62612B Displaced fracture of proximal phalanx of right middle finger, initial encounter for open fracture: Secondary | ICD-10-CM | POA: Diagnosis not present

## 2017-12-09 NOTE — Evaluation (Addendum)
Occupational Therapy Evaluation Patient Details Name: Daniel Kelley MRN: [id number] DOB: February 01, 1986 Today's Date: 12/09/2017    History of Present Illness Pt is a 32 y/o male admitted after sustaining an injury to his R hand after falling from a hover board at home. Pt is s/p open Tx PIP dislocations right MF AND RF with I&D, SF closed red PIP. PMH including but not limited to HTN.    Clinical Impression   PTA patient independent and working.  Currently admitted for above and limited by NWB R UE, decreased functional use of  Dominant RUE, and pain.  Patient demonstrates ability to complete ADLs and functional mobility at modified independent level, educated on precautions, safety, ADL compensatory techniques, pain and edema management,  and exercises.  At this time, no further OT needs identified. Thank you for this referral.  OT signing off.     Follow Up Recommendations  No OT follow up;Follow surgeon's recommendation for DC plan and follow-up therapies    Equipment Recommendations  None recommended by OT    Recommendations for Other Services       Precautions / Restrictions Precautions Precautions: Fall Restrictions Weight Bearing Restrictions: Yes RLE Weight Bearing: Non weight bearing      Mobility Bed Mobility Overal bed mobility: Modified Independent                Transfers Overall transfer level: Modified independent Equipment used: None                  Balance Overall balance assessment: Needs assistance Sitting-balance support: Feet supported Sitting balance-Leahy Scale: Normal     Standing balance support: No upper extremity supported Standing balance-Leahy Scale: Good                             ADL either performed or assessed with clinical judgement   ADL Overall ADL's : Modified independent                                       General ADL Comments: Patient educated on compensatory techniques for ADLs,  demonstrating completion of dressing and transfers without assist after education.  Patient plans to complete basin baths at dc for protection of R splint.  Encouarged use of elastic waist band pants, and using L hand for pushing/pulling.      Vision Baseline Vision/History: No visual deficits Vision Assessment?: No apparent visual deficits     Perception     Praxis      Pertinent Vitals/Pain Pain Assessment: Faces Faces Pain Scale: Hurts a little bit Pain Location: R hand Pain Descriptors / Indicators: Sore Pain Intervention(s): Monitored during session     Hand Dominance Right   Extremity/Trunk Assessment Upper Extremity Assessment Upper Extremity Assessment: Overall WFL for tasks assessed;RUE deficits/detail RUE Deficits / Details: WFL to shoulder/elbow, post op splint in place to wrist and hand  RUE: Unable to fully assess due to immobilization RUE Sensation: WNL RUE Coordination: decreased fine motor   Lower Extremity Assessment Lower Extremity Assessment: Defer to PT evaluation   Cervical / Trunk Assessment Cervical / Trunk Assessment: Normal   Communication Communication Communication: No difficulties   Cognition Arousal/Alertness: Awake/alert Behavior During Therapy: WFL for tasks assessed/performed Overall Cognitive Status: Within Functional Limits for tasks assessed  General Comments  educated on ice and elevation, continued ROM at shoulder and elbow     Exercises     Shoulder Instructions      Home Living Family/patient expects to be discharged to:: Private residence Living Arrangements: Spouse/significant other Available Help at Discharge: Family;Available PRN/intermittently Type of Home: House Home Access: Level entry     Home Layout: Two level;Bed/bath upstairs Alternate Level Stairs-Number of Steps: flight   Bathroom Shower/Tub: Chief Strategy Officer: Standard     Home  Equipment: None          Prior Functioning/Environment Level of Independence: Independent        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 Problem List: Decreased range of motion;Decreased coordination;Decreased knowledge of precautions;Pain;Impaired UE functional use      OT Treatment/Interventions:      OT Goals(Current goals can be found in the care plan section) Acute Rehab OT Goals Patient Stated Goal: return home OT Goal Formulation: With patient  OT Frequency:     Barriers to D/C:            Co-evaluation              AM-PAC PT "6 Clicks" Daily Activity     Outcome Measure Help from another person eating meals?: None Help from another person taking care of personal grooming?: None Help from another person toileting, which includes using toliet, bedpan, or urinal?: None Help from another person bathing (including washing, rinsing, drying)?: None Help from another person to put on and taking off regular upper body clothing?: None Help from another person to put on and taking off regular lower body clothing?: None 6 Click Score: 24   End of Session Nurse Communication: Mobility status;Other (comment)(IV remoal for shirt)  Activity Tolerance: Patient tolerated treatment well Patient left: with call bell/phone within reach;Other (comment)(seated EOB)  OT Visit Diagnosis: Pain Pain - Right/Left: Right Pain - part of body: Hand                Time: 2130-8657 OT Time Calculation (min): 15 min Charges:  OT General Charges $OT Visit: 1 Visit OT Evaluation $OT Eval Low Complexity: 1 Low  Chancy Milroy, OT Acute Rehabilitation Services Pager [phone] Office [phone]   Chancy Milroy 12/09/2017, 10:04 AM

## 2017-12-09 NOTE — Discharge Summary (Signed)
Physician Discharge Summary  Patient ID: Daniel Kelley MRN: [id number] DOB/AGE: [birth year] 32 y.o.  Admit date: 12/08/2017 Discharge date:   Admission Diagnoses: MULTIPLE FRACTURES, RIGHT HAND Past Medical History:  Diagnosis Date  . Hypertension   . Tonsillitis     Discharge Diagnoses:  Active Problems:   Multiple fractures of right hand bones   Multiple dislocations of fingers, open, initial encounter   Surgeries: Procedure(s): IRRIGATION AND DEBRIDEMENT EXTREMITY, RIGHT HAND on 12/08/2017    Consultants:   Discharged Condition: Improved  Hospital Course: Daniel Kelley is an 32 y.o. male who was admitted 12/08/2017 with a chief complaint of  Chief Complaint  Patient presents with  . Fall  . Hand Injury  , and found to have a diagnosis of MULTIPLE FRACTURES, RIGHT HAND.  They were brought to the operating room on 12/08/2017 and underwent Procedure(s): IRRIGATION AND DEBRIDEMENT EXTREMITY, RIGHT HAND.    They were given perioperative antibiotics:  Anti-infectives (From admission, onward)   Start     Dose/Rate Route Frequency Ordered Stop   12/08/17 1100  clindamycin (CLEOCIN) IVPB 600 mg     600 mg 100 mL/hr over 30 Minutes Intravenous Every 8 hours 12/08/17 0704     12/08/17 0700  clindamycin (CLEOCIN) IVPB 600 mg  Status:  Discontinued     600 mg 100 mL/hr over 30 Minutes Intravenous Every 8 hours 12/08/17 0657 12/08/17 0704   12/08/17 0330  clindamycin (CLEOCIN) IVPB 600 mg     600 mg 100 mL/hr over 30 Minutes Intravenous  Once 12/08/17 0322 12/08/17 0420    .  They were given sequential compression devices, early ambulation, and Other (comment) for DVT prophylaxis.  Recent vital signs:  Patient Vitals for the past 24 hrs:  BP Temp Temp src Pulse Resp SpO2  12/09/17 0520 (!) 146/96 97.8 F (36.6 C) Oral 74 - 98 %  12/08/17 2024 (!) 155/97 98.5 F (36.9 C) Oral 99 - 98 %  12/08/17 1422 (!) 143/88 98.2 F (36.8 C) Oral 97 18 99 %  .  Recent laboratory studies:  Dg Hand Complete Right  Result Date: 12/08/2017 CLINICAL DATA:  Fall off hover board, laceration to third and fourth digit. EXAM: RIGHT HAND - COMPLETE 3+ VIEW COMPARISON:  None. FINDINGS: There is radial subluxation of the fourth digit at the proximal interphalangeal joint, not well assessed on the lateral view due to osseous overlap. Dorsal dislocation of the fifth digit at the proximal interphalangeal joint. Third digit laceration at the proximal interphalangeal joint with skin defect. No radiopaque foreign body. No fractures of the digits or hand. IMPRESSION: 1. Dorsal dislocation of the fifth digit at the proximal interphalangeal joint. Radial subluxation of the fourth digit at the proximal interphalangeal joint, not well assessed on the lateral view due to osseous overlap. 2. Third digit laceration without radiopaque foreign body. The fourth digit laceration is not well seen radiographically. Electronically Signed   By: Narda Rutherford M.D.   On: 12/08/2017 01:47    Discharge Medications:   Allergies as of 12/09/2017      Reactions   Shrimp [shellfish Allergy] Hives, Itching, Swelling, Rash   Not anaphalaxis   Penicillins       Medication List    TAKE these medications   fluticasone 50 MCG/ACT nasal spray Commonly known as:  FLONASE Place 2 sprays into both nostrils daily.   losartan 50 MG tablet Commonly known as:  COZAAR Take 1 tablet (50 mg total) by mouth 2 (two) times daily.  Diagnostic Studies: Dg Hand Complete Right  Result Date: 12/08/2017 CLINICAL DATA:  Fall off hover board, laceration to third and fourth digit. EXAM: RIGHT HAND - COMPLETE 3+ VIEW COMPARISON:  None. FINDINGS: There is radial subluxation of the fourth digit at the proximal interphalangeal joint, not well assessed on the lateral view due to osseous overlap. Dorsal dislocation of the fifth digit at the proximal interphalangeal joint. Third digit laceration at the proximal interphalangeal joint with skin  defect. No radiopaque foreign body. No fractures of the digits or hand. IMPRESSION: 1. Dorsal dislocation of the fifth digit at the proximal interphalangeal joint. Radial subluxation of the fourth digit at the proximal interphalangeal joint, not well assessed on the lateral view due to osseous overlap. 2. Third digit laceration without radiopaque foreign body. The fourth digit laceration is not well seen radiographically. Electronically Signed   By: Narda Rutherford M.D.   On: 12/08/2017 01:47    They benefited maximally from their hospital stay and there were no complications.     Disposition: Discharge disposition: 01-Home or Self Care      Discharge Instructions    Call MD / Call 911   Complete by:  As directed    If you experience chest pain or shortness of breath, CALL 911 and be transported to the hospital emergency room.  If you develope a fever above 101 F, pus (white drainage) or increased drainage or redness at the wound, or calf pain, call your surgeon's office.   Constipation Prevention   Complete by:  As directed    Drink plenty of fluids.  Prune juice may be helpful.  You may use a stool softener, such as Colace (over the counter) 100 mg twice a day.  Use MiraLax (over the counter) for constipation as needed.   Diet - low sodium heart healthy   Complete by:  As directed    Increase activity slowly as tolerated   Complete by:  As directed      Follow-up Information    Dominica Severin, MD Follow up in 8 day(s).   Specialty:  Orthopedic Surgery Why:  We will call for your follow-up appointment to begin therapy in 1 week and see Dr. Amanda Pea in 2 weeks Contact information: [address] [place] [address] [phone]           Status post irrigation debridement and repair of multiple dislocations about the PIP joints as described in the operative note.  Patient will be discharged home on Bactrim DS 1 p.o. twice daily.  OxyIR as needed pain.   Follow-up in my office in 1 week for therapy 2 weeks from my eval.  All questions have been addressed.  Patient was stable awake alert and oriented at the time of discharge no signs of DVT, hospital-acquired infection, or other issue. Signed: Dionne Ano Antavion Bartoszek III 12/09/2017, 9:15 AM

## 2017-12-09 NOTE — Plan of Care (Signed)

## 2017-12-09 NOTE — Discharge Instructions (Signed)
Please and move your fingers with flexion attempts within the confines of your splint.  Please call for any problems.  We recommend that you to take vitamin C 1000 mg a day to promote healing. We also recommend that if you require  pain medicine that you take a stool softener to prevent constipation as most pain medicines will have constipation side effects. We recommend either Peri-Colace or Senokot and recommend that you also consider adding MiraLAX as well to prevent the constipation affects from pain medicine if you are required to use them. These medicines are over the counter and may be purchased at a local pharmacy. A cup of yogurt and a probiotic can also be helpful during the recovery process as the medicines can disrupt your intestinal environment.   Keep bandage clean and dry.  Call for any problems.  No smoking.  Criteria for driving a car: you should be off your pain medicine for 7-8 hours, able to drive one handed(confident), thinking clearly and feeling able in your judgement to drive. Continue elevation as it will decrease swelling.  If instructed by MD move your fingers within the confines of the bandage/splint.  Use ice if instructed by your MD. Call immediately for any sudden loss of feeling in your hand/arm or change in functional abilities of the extremity.

## 2017-12-09 NOTE — Progress Notes (Signed)
Discharge instructions completed with pt.  Pt verbalized understanding of the information.  Pt denies chest pain, shortness of breath, dizziness, lightheadedness, and n/v.  Pt's IV discontinued.  Pt waiting for ride home.  

## 2017-12-10 ENCOUNTER — Encounter (HOSPITAL_COMMUNITY): Payer: Self-pay | Admitting: Orthopedic Surgery

## 2017-12-13 NOTE — Anesthesia Postprocedure Evaluation (Signed)
Anesthesia Post Note  Patient: Daniel Kelley  Procedure(s) Performed: IRRIGATION AND DEBRIDEMENT EXTREMITY, RIGHT HAND (Right Hand)     Patient location during evaluation: PACU Anesthesia Type: General Level of consciousness: awake and alert Pain management: pain level controlled Vital Signs Assessment: post-procedure vital signs reviewed and stable Respiratory status: spontaneous breathing, nonlabored ventilation, respiratory function stable and patient connected to nasal cannula oxygen Cardiovascular status: blood pressure returned to baseline and stable Postop Assessment: no apparent nausea or vomiting Anesthetic complications: no    Last Vitals:  Vitals:   12/08/17 2024 12/09/17 0520  BP: (!) 155/97 (!) 146/96  Pulse: 99 74  Resp:    Temp: 36.9 C 36.6 C  SpO2: 98% 98%    Last Pain:  Vitals:   12/09/17 0930  TempSrc:   PainSc: 2                  Salvadore Valvano

## 2017-12-24 DIAGNOSIS — S61219A Laceration without foreign body of unspecified finger without damage to nail, initial encounter: Secondary | ICD-10-CM | POA: Insufficient documentation

## 2018-01-13 ENCOUNTER — Encounter (HOSPITAL_COMMUNITY): Payer: Self-pay | Admitting: Emergency Medicine

## 2018-01-13 ENCOUNTER — Ambulatory Visit (INDEPENDENT_AMBULATORY_CARE_PROVIDER_SITE_OTHER): Payer: PRIVATE HEALTH INSURANCE

## 2018-01-13 ENCOUNTER — Ambulatory Visit (HOSPITAL_COMMUNITY)
Admission: EM | Admit: 2018-01-13 | Discharge: 2018-01-13 | Disposition: A | Discharge status: Home / Self Care | Payer: PRIVATE HEALTH INSURANCE | Attending: Family Medicine | Admitting: Family Medicine

## 2018-01-13 DIAGNOSIS — J22 Unspecified acute lower respiratory infection: Secondary | ICD-10-CM

## 2018-01-13 MED ORDER — AZITHROMYCIN 250 MG PO TABS: 250.0000 mg | ORAL_TABLET | Freq: Every day | ORAL | 0 refills | Status: DC

## 2018-01-13 MED ORDER — ALBUTEROL SULFATE HFA 108 (90 BASE) MCG/ACT IN AERS: 1.0000 | INHALATION_SPRAY | Freq: Four times a day (QID) | RESPIRATORY_TRACT | 0 refills | Status: DC | PRN

## 2018-01-13 NOTE — ED Triage Notes (Signed)
Pt here for URI sx and congestion 

## 2018-01-13 NOTE — ED Provider Notes (Signed)
[facility]    CSN: [id number] Arrival date & time: 01/13/18  1359     History   Chief Complaint Chief Complaint  Patient presents with  . URI    HPI Rain Hensch is a 32 y.o. male.   Patient is a 32 year old male presents with worsening cough over the past week. Symptoms have been constant.  He has been taking Mucinex without any relief of symptoms.  He reports some shortness of breath and fatigue at times. He has had some yellow mucous.   Denies any history of asthma.  He does not smoke.  He denies any ear pain, sore throat, nasal congestion. No recent sick contacts.   ROS per HPI      Past Medical History:  Diagnosis Date  . Hypertension   . Tonsillitis     Patient Active Problem List   Diagnosis Date Noted  . Multiple fractures of right hand bones 12/08/2017  . Multiple dislocations of fingers, open, initial encounter 12/08/2017  . Essential hypertension 08/23/2017  . Hyperlipidemia LDL goal <100 08/23/2017    Past Surgical History:  Procedure Laterality Date  . I&D EXTREMITY Right 12/08/2017   Procedure: IRRIGATION AND DEBRIDEMENT EXTREMITY, RIGHT HAND;  Surgeon: Dominica Severin, MD;  Location: [facility];  Service: Orthopedics;  Laterality: Right;  . NO PAST SURGERIES    . TONSILLECTOMY N/A 10/01/2015   Procedure: TONSILLECTOMY;  Surgeon: Drema Halon, MD;  Location: [facility];  Service: ENT;  Laterality: N/A;       Home Medications    Prior to Admission medications   Medication Sig Start Date End Date Taking? Authorizing Provider  albuterol (PROVENTIL HFA;VENTOLIN HFA) 108 (90 Base) MCG/ACT inhaler Inhale 1-2 puffs into the lungs every 6 (six) hours as needed for wheezing or shortness of breath. 01/13/18   Charita Lindenberger, Gloris Manchester A, NP  azithromycin (ZITHROMAX) 250 MG tablet Take 1 tablet (250 mg total) by mouth daily. Take first 2 tablets together, then 1 every day until finished. 01/13/18   Ambrie Carte, Gloris Manchester A, NP  fluticasone (FLONASE) 50  MCG/ACT nasal spray Place 2 sprays into both nostrils daily. 11/23/17   Mike Gip, FNP  losartan (COZAAR) 50 MG tablet Take 1 tablet (50 mg total) by mouth 2 (two) times daily. 12/07/17   Mike Gip, FNP    Family History History reviewed. No pertinent family history.  Social History Social History   Tobacco Use  . Smoking status: Former Smoker    Types: Cigarettes  . Smokeless tobacco: Former Engineer, water Use Topics  . Alcohol use: Yes    Comment: couple times weekly   . Drug use: No     Allergies   Shrimp [shellfish allergy] and Penicillins   Review of Systems Review of Systems   Physical Exam Triage Vital Signs ED Triage Vitals  Enc Vitals Group     BP 01/13/18 1449 (!) 165/96     Pulse Rate 01/13/18 1449 (!) 105     Resp 01/13/18 1449 18     Temp 01/13/18 1449 98.7 F (37.1 C)     Temp Source 01/13/18 1449 Oral     SpO2 01/13/18 1449 97 %     Weight --      Height --      Head Circumference --      Peak Flow --      Pain Score 01/13/18 1450 5     Pain Loc --      Pain Edu? --  Excl. in GC? --    No data found.  Updated Vital Signs BP 140/90   Pulse (!) 104   Temp 98.7 F (37.1 C) (Oral)   Resp 18   SpO2 95%   Visual Acuity Right Eye Distance:   Left Eye Distance:   Bilateral Distance:    Right Eye Near:   Left Eye Near:    Bilateral Near:     Physical Exam  Constitutional: He appears well-developed and well-nourished.  Very pleasant. Non toxic or ill appearing.   HENT:  Head: Normocephalic and atraumatic.  Right Ear: External ear normal.  Left Ear: External ear normal.  Bilateral TMs normal.  External ears normal.  Without posterior oropharyngeal erythema, tonsillar swelling or exudates. No lesions.  No lymphadenopathy.   Eyes: Conjunctivae are normal.  Neck: Normal range of motion.  Cardiovascular: Normal rate and normal heart sounds.  Mildly tachycardic  Pulmonary/Chest: Effort normal.  Expiratory wheezing and  rhonchi noted in left upper and lower lobes  Musculoskeletal: Normal range of motion.  Neurological: He is alert.  Skin: Skin is warm and dry. No rash noted. No erythema. No pallor.  Psychiatric: He has a normal mood and affect.  Nursing note and vitals reviewed.    UC Treatments / Results  Labs (all labs ordered are listed, but only abnormal results are displayed) Labs Reviewed - No data to display  EKG None  Radiology Dg Chest 2 View  Result Date: 01/13/2018 CLINICAL DATA:  Patient with cough. EXAM: CHEST - 2 VIEW COMPARISON:  None. FINDINGS: Normal cardiac and mediastinal contours. No consolidative pulmonary opacities. No pleural effusion or pneumothorax. Regional skeleton is unremarkable. IMPRESSION: No acute cardiopulmonary process. Electronically Signed   By: Annia Belt M.D.   On: 01/13/2018 15:42    Procedures Procedures (including critical care time)  Medications Ordered in UC Medications - No data to display  Initial Impression / Assessment and Plan / UC Course  I have reviewed the triage vital signs and the nursing notes.  Pertinent labs & imaging results that were available during my care of the patient were reviewed by me and considered in my medical decision making (see chart for details).     Patient is a 32 year old male that presents with 1 week of worsening cough.  Expiratory wheezing and rhonchi noted in left upper and lower lobes.  Will get chest x-ray to rule out pneumonia. X-ray did not reveal any abnormalities we will go ahead and treat for pneumonia based on history and physical exam. Albuterol inhaler to use as needed for cough, wheezing, shortness of breath Told to continue Mucinex Follow up as needed for continued or worsening symptoms  Final Clinical Impressions(s) / UC Diagnoses   Final diagnoses:  Lower respiratory tract infection     Discharge Instructions     I am going to go ahead and treat you for pneumonia. Take the antibiotic as  prescribed Albuterol inhaler for cough, wheezing, SOB as needed.  Follow up as needed for continued or worsening symptoms     ED Prescriptions    Medication Sig Dispense Auth. Provider   azithromycin (ZITHROMAX) 250 MG tablet Take 1 tablet (250 mg total) by mouth daily. Take first 2 tablets together, then 1 every day until finished. 6 tablet Shayley Medlin A, NP   albuterol (PROVENTIL HFA;VENTOLIN HFA) 108 (90 Base) MCG/ACT inhaler Inhale 1-2 puffs into the lungs every 6 (six) hours as needed for wheezing or shortness of breath. 1 Inhaler [place], Evely Gainey  A, NP     Controlled Substance Prescriptions [place] Controlled Substance Registry consulted? Not Applicable   Janace Aris, NP 01/13/18 1614

## 2018-01-13 NOTE — Discharge Instructions (Addendum)
I am going to go ahead and treat you for pneumonia. Take the antibiotic as prescribed Albuterol inhaler for cough, wheezing, SOB as needed.  Follow up as needed for continued or worsening symptoms

## 2018-02-22 ENCOUNTER — Ambulatory Visit: Payer: Self-pay | Admitting: Family Medicine

## 2018-02-25 ENCOUNTER — Encounter: Payer: Self-pay | Admitting: Family Medicine

## 2018-02-25 ENCOUNTER — Ambulatory Visit (INDEPENDENT_AMBULATORY_CARE_PROVIDER_SITE_OTHER): Payer: Self-pay | Admitting: Family Medicine

## 2018-02-25 VITALS — BP 142/92 | HR 82 | Temp 97.8°F | Resp 16 | Ht 66.0 in | Wt 234.0 lb

## 2018-02-25 DIAGNOSIS — Z23 Encounter for immunization: Secondary | ICD-10-CM

## 2018-02-25 DIAGNOSIS — I10 Essential (primary) hypertension: Secondary | ICD-10-CM

## 2018-02-25 LAB — POCT URINALYSIS DIPSTICK
Bilirubin, UA: NEGATIVE
Blood, UA: NEGATIVE
Glucose, UA: NEGATIVE
Ketones, UA: NEGATIVE
Leukocytes, UA: NEGATIVE
Nitrite, UA: NEGATIVE
Protein, UA: POSITIVE — AB
Spec Grav, UA: 1.025 (ref 1.010–1.025)
Urobilinogen, UA: 0.2 E.U./dL
pH, UA: 6 (ref 5.0–8.0)

## 2018-02-25 MED ORDER — LOSARTAN POTASSIUM 50 MG PO TABS: 50.0000 mg | ORAL_TABLET | Freq: Two times a day (BID) | ORAL | 1 refills | Status: DC

## 2018-02-25 NOTE — Patient Instructions (Signed)
Managing Your Hypertension  Hypertension is commonly called high blood pressure. This is when the force of your blood pressing against the walls of your arteries is too strong. Arteries are blood vessels that carry blood from your heart throughout your body. Hypertension forces the heart to work harder to pump blood, and may cause the arteries to become narrow or stiff. Having untreated or uncontrolled hypertension can cause heart attack, stroke, kidney disease, and other problems.  What are blood pressure readings?  A blood pressure reading consists of a higher number over a lower number. Ideally, your blood pressure should be below 120/80. The first ("top") number is called the systolic pressure. It is a measure of the pressure in your arteries as your heart beats. The second ("bottom") number is called the diastolic pressure. It is a measure of the pressure in your arteries as the heart relaxes.  What does my blood pressure reading mean?  Blood pressure is classified into four stages. Based on your blood pressure reading, your health care provider may use the following stages to determine what type of treatment you need, if any. Systolic pressure and diastolic pressure are measured in a unit called mm Hg.  Normal   Systolic pressure: below 120.   Diastolic pressure: below 80.  Elevated   Systolic pressure: 120-129.   Diastolic pressure: below 80.  Hypertension stage 1   Systolic pressure: 130-139.   Diastolic pressure: 80-89.  Hypertension stage 2   Systolic pressure: 140 or above.   Diastolic pressure: 90 or above.  What health risks are associated with hypertension?  Managing your hypertension is an important responsibility. Uncontrolled hypertension can lead to:   A heart attack.   A stroke.   A weakened blood vessel (aneurysm).   Heart failure.   Kidney damage.   Eye damage.   Metabolic syndrome.   Memory and concentration problems.  What changes can I make to manage my  hypertension?  Hypertension can be managed by making lifestyle changes and possibly by taking medicines. Your health care provider will help you make a plan to bring your blood pressure within a normal range.  Eating and drinking     Eat a diet that is high in fiber and potassium, and low in salt (sodium), added sugar, and fat. An example eating plan is called the DASH (Dietary Approaches to Stop Hypertension) diet. To eat this way:  ? Eat plenty of fresh fruits and vegetables. Try to fill half of your plate at each meal with fruits and vegetables.  ? Eat whole grains, such as whole wheat pasta, brown rice, or whole grain bread. Fill about one quarter of your plate with whole grains.  ? Eat low-fat diary products.  ? Avoid fatty cuts of meat, processed or cured meats, and poultry with skin. Fill about one quarter of your plate with lean proteins such as fish, chicken without skin, beans, eggs, and tofu.  ? Avoid premade and processed foods. These tend to be higher in sodium, added sugar, and fat.   Reduce your daily sodium intake. Most people with hypertension should eat less than 1,500 mg of sodium a day.   Limit alcohol intake to no more than 1 drink a day for nonpregnant women and 2 drinks a day for men. One drink equals 12 oz of beer, 5 oz of wine, or 1 oz of hard liquor.  Lifestyle   Work with your health care provider to maintain a healthy body weight, or to lose   weight. Ask what an ideal weight is for you.   Get at least 30 minutes of exercise that causes your heart to beat faster (aerobic exercise) most days of the week. Activities may include walking, swimming, or biking.   Include exercise to strengthen your muscles (resistance exercise), such as weight lifting, as part of your weekly exercise routine. Try to do these types of exercises for 30 minutes at least 3 days a week.   Do not use any products that contain nicotine or tobacco, such as cigarettes and e-cigarettes. If you need help quitting,  ask your health care provider.   Control any long-term (chronic) conditions you have, such as high cholesterol or diabetes.  Monitoring   Monitor your blood pressure at home as told by your health care provider. Your personal target blood pressure may vary depending on your medical conditions, your age, and other factors.   Have your blood pressure checked regularly, as often as told by your health care provider.  Working with your health care provider   Review all the medicines you take with your health care provider because there may be side effects or interactions.   Talk with your health care provider about your diet, exercise habits, and other lifestyle factors that may be contributing to hypertension.   Visit your health care provider regularly. Your health care provider can help you create and adjust your plan for managing hypertension.  Will I need medicine to control my blood pressure?  Your health care provider may prescribe medicine if lifestyle changes are not enough to get your blood pressure under control, and if:   Your systolic blood pressure is 130 or higher.   Your diastolic blood pressure is 80 or higher.  Take medicines only as told by your health care provider. Follow the directions carefully. Blood pressure medicines must be taken as prescribed. The medicine does not work as well when you skip doses. Skipping doses also puts you at risk for problems.  Contact a health care provider if:   You think you are having a reaction to medicines you have taken.   You have repeated (recurrent) headaches.   You feel dizzy.   You have swelling in your ankles.   You have trouble with your vision.  Get help right away if:   You develop a severe headache or confusion.   You have unusual weakness or numbness, or you feel faint.   You have severe pain in your chest or abdomen.   You vomit repeatedly.   You have trouble breathing.  Summary   Hypertension is when the force of blood pumping  through your arteries is too strong. If this condition is not controlled, it may put you at risk for serious complications.   Your personal target blood pressure may vary depending on your medical conditions, your age, and other factors. For most people, a normal blood pressure is less than 120/80.   Hypertension is managed by lifestyle changes, medicines, or both. Lifestyle changes include weight loss, eating a healthy, low-sodium diet, exercising more, and limiting alcohol.  This information is not intended to replace advice given to you by your health care provider. Make sure you discuss any questions you have with your health care provider.  Document Released: 11/15/2011 Document Revised: 01/19/2016 Document Reviewed: 01/19/2016  [employer]vier Interactive Patient Education  2019 [employer]

## 2018-02-25 NOTE — Progress Notes (Signed)
[facility]   Progress Note: General Provider: Mike GipAndre Emmah Bratcher, FNP  SUBJECTIVE:   Daniel Kelley is a 32 y.o. male who  has a past medical history of Hypertension and Tonsillitis.. Patient presents today for Hypertension Patient presents for follow-up on hypertension.  He reports compliance with medication.  He continues to take losartan 50 mg twice daily.  Patient states that he also takes a multivitamin with an energy enhancement.  He states that this may be why his blood pressure is elevated today.  He denies chest pain shortness of breath dizziness or leg swelling.  Denies side effects from medication and would like to continue with the current dose. After last visit patient had an accident where he cut his hand.  This required surgery.  He states that his hand is still not 100%, but it has improved.  Because of his hand he has been unable to work and also work out.  He states that he will try to lose weight in the new year. Review of Systems  Constitutional: Negative.   HENT: Negative.   Eyes: Negative.   Respiratory: Negative.   Cardiovascular: Negative.   Gastrointestinal: Negative.   Genitourinary: Negative.   Musculoskeletal: Negative.   Skin: Negative.   Neurological: Negative.   Psychiatric/Behavioral: Negative.      OBJECTIVE: BP (!) 142/92 Comment: manually  Pulse 82   Temp 97.8 F (36.6 C) (Oral)   Resp 16   Ht 5\' 6"  (1.676 m)   Wt 234 lb (106.1 kg)   SpO2 100%   BMI 37.77 kg/m   Wt Readings from Last 3 Encounters:  02/25/18 234 lb (106.1 kg)  12/08/17 231 lb 0.7 oz (104.8 kg)  11/23/17 231 lb (104.8 kg)     Physical Exam Vitals signs and nursing note reviewed.  Constitutional:      General: He is not in acute distress.    Appearance: He is well-developed.  HENT:     Head: Normocephalic and atraumatic.  Eyes:     Conjunctiva/sclera: Conjunctivae normal.     Pupils: Pupils are equal, round, and reactive to  light.  Neck:     Musculoskeletal: Normal range of motion.  Cardiovascular:     Rate and Rhythm: Normal rate and regular rhythm.     Heart sounds: Normal heart sounds.  Pulmonary:     Effort: Pulmonary effort is normal. No respiratory distress.     Breath sounds: Normal breath sounds.  Abdominal:     General: Bowel sounds are normal. There is no distension.     Palpations: Abdomen is soft.  Musculoskeletal: Normal range of motion.  Skin:    General: Skin is warm and dry.  Neurological:     Mental Status: He is alert and oriented to person, place, and time.  Psychiatric:        Behavior: Behavior normal.        Thought Content: Thought content normal.     ASSESSMENT/PLAN:  1. Essential hypertension Patient to return to care in 2 weeks for blood pressure check.  He is advised to take his medication a few hours prior to coming in for the visit.  And also he needs to discontinue the multivitamin with energy enhancer due to increasing the blood pressure. - Urinalysis Dipstick - losartan (COZAAR) 50 MG tablet; Take 1 tablet (50 mg total) by mouth 2 (two) times daily.  Dispense: 180 tablet; Refill: 1    Return in about 3 months (  around 05/27/2018), or 2 weeks BP check.    The patient was given clear instructions to go to ER or return to medical center if symptoms do not improve, worsen or new problems develop. The patient verbalized understanding and agreed with plan of care.   Ms. Daniel Jacksonndr [name], FNP-BC [facility] [address]  [place], [place] [address] [phone]

## 2018-03-10 ENCOUNTER — Other Ambulatory Visit: Payer: Self-pay | Admitting: Family Medicine

## 2018-03-10 DIAGNOSIS — I10 Essential (primary) hypertension: Secondary | ICD-10-CM

## 2018-03-12 ENCOUNTER — Other Ambulatory Visit: Payer: Self-pay

## 2018-03-12 ENCOUNTER — Ambulatory Visit (INDEPENDENT_AMBULATORY_CARE_PROVIDER_SITE_OTHER): Payer: Self-pay

## 2018-03-12 VITALS — BP 148/92

## 2018-03-12 DIAGNOSIS — I10 Essential (primary) hypertension: Secondary | ICD-10-CM

## 2018-03-12 DIAGNOSIS — Z0131 Encounter for examination of blood pressure with abnormal findings: Secondary | ICD-10-CM

## 2018-03-12 MED ORDER — LOSARTAN POTASSIUM 100 MG PO TABS: 50.0000 mg | ORAL_TABLET | Freq: Every day | ORAL | 3 refills | Status: DC

## 2018-03-12 MED ORDER — LOSARTAN POTASSIUM 50 MG PO TABS: 50.0000 mg | ORAL_TABLET | Freq: Two times a day (BID) | ORAL | 1 refills | Status: DC

## 2018-03-12 NOTE — Progress Notes (Signed)
Patient was informed to continue medication, keep a log of daily blood pressure readings and bring to next appointment. Advised patient to eat a low sodium diet and encouraged patient to exercise 3 to 5 times weekly to help loose weight. Patient verbalized understanding. Thanks!

## 2018-04-04 ENCOUNTER — Ambulatory Visit: Payer: Self-pay

## 2018-04-04 VITALS — BP 126/82

## 2018-04-04 DIAGNOSIS — I10 Essential (primary) hypertension: Secondary | ICD-10-CM

## 2018-04-04 NOTE — Progress Notes (Signed)
Patient states he feels dizzy and he believes it is due to bp medicine. BP was in range today. Patient states he wants to take medication 1/2 tablet in the morning and 1/2 at lunch.

## 2018-05-14 ENCOUNTER — Other Ambulatory Visit: Payer: Self-pay

## 2018-05-14 DIAGNOSIS — I10 Essential (primary) hypertension: Secondary | ICD-10-CM

## 2018-05-14 MED ORDER — LOSARTAN POTASSIUM 100 MG PO TABS: 50.0000 mg | ORAL_TABLET | Freq: Every day | ORAL | 3 refills | Status: DC

## 2018-05-26 ENCOUNTER — Telehealth: Payer: Self-pay

## 2018-05-26 NOTE — Telephone Encounter (Signed)
Patient telephoned regarding their 0820 follow up appointment on 05/27/2018.  Offered a telephone visit with the provider.  Patient accepted the offer and advised provider will call them around their scheduled appointment time.

## 2018-05-27 ENCOUNTER — Telehealth (INDEPENDENT_AMBULATORY_CARE_PROVIDER_SITE_OTHER): Payer: Self-pay | Admitting: Family Medicine

## 2018-05-27 ENCOUNTER — Other Ambulatory Visit: Payer: Self-pay

## 2018-05-27 DIAGNOSIS — I10 Essential (primary) hypertension: Secondary | ICD-10-CM

## 2018-05-27 DIAGNOSIS — E785 Hyperlipidemia, unspecified: Secondary | ICD-10-CM

## 2018-05-27 MED ORDER — LOSARTAN POTASSIUM-HCTZ 100-12.5 MG PO TABS: 1.0000 | ORAL_TABLET | Freq: Every day | ORAL | 3 refills | Status: DC

## 2018-05-27 NOTE — Progress Notes (Signed)
Virtual Visit via Telephone Note  I connected with Daniel Kelley on 05/27/18 at  8:20 AM EDT by telephone and verified that I am speaking with the correct person using two identifiers.   I discussed the limitations, risks, security and privacy concerns of performing an evaluation and management service by telephone and the availability of in person appointments. I also discussed with the patient that there may be a patient responsible charge related to this service. The patient expressed understanding and agreed to proceed.   History of Present Illness: Patient with a history of HTN. He states denies a low sodium diet or regular exercise. He states that he is taking 50 mg of Losartan daily. He is checking his blood pressures and they are 150s systolic and 89-100 diastolic. He states that his prescription was changed to 50mg  daily instead of BID when it was transferred to [facility]. He denies CP, SOB, and dizziness. He does endorse swelling of the upper extremities occasionally. He is doing well and denies side effects of his medications.     Observations/Objective: Patient does not sound to be in any apparent distress. His tone is of regular rate and rhythm.    Assessment and Plan: 1. Essential hypertension D/c Losartan 50mg  BID and start Hyzaar 100-12.5mg  QD. We discussed diet and the importance of a low sodium diet, heart healthy eating to include increased water intake, increased whole foods such as fruits and vegetables and lean meats.  - losartan-hydrochlorothiazide (HYZAAR) 100-12.5 MG tablet; Take 1 tablet by mouth daily.  Dispense: 90 tablet; Refill: 3  2. Hyperlipidemia LDL goal <100 Patient would like to try diet and exercise for this. He was encouraged to exercise for 150 minutes per week. Encouraged to walk briskly for 30 minutes after dinner with family.     Follow Up Instructions:  Patient to call with problems or side effects of medications. We discussed that he may have  increased urination due to starting a diuretic.  I discussed the assessment and treatment plan with the patient. The patient was provided an opportunity to ask questions and all were answered. The patient agreed with the plan and demonstrated an understanding of the instructions.   The patient was advised to call back or seek an in-person evaluation if the symptoms worsen or if the condition fails to improve as anticipated.  I provided 15 minutes of non-face-to-face time during this encounter.   [name], FNP-BC [facility] [address] [place], [place] [address] [phone]

## 2018-05-29 ENCOUNTER — Other Ambulatory Visit: Payer: Self-pay | Admitting: *Deleted

## 2018-05-29 DIAGNOSIS — I10 Essential (primary) hypertension: Secondary | ICD-10-CM

## 2018-05-29 MED ORDER — LOSARTAN POTASSIUM-HCTZ 100-12.5 MG PO TABS: 1.0000 | ORAL_TABLET | Freq: Every day | ORAL | 3 refills | Status: DC

## 2018-07-31 ENCOUNTER — Encounter (HOSPITAL_BASED_OUTPATIENT_CLINIC_OR_DEPARTMENT_OTHER): Payer: Self-pay | Admitting: Emergency Medicine

## 2018-07-31 ENCOUNTER — Other Ambulatory Visit: Payer: Self-pay

## 2018-07-31 ENCOUNTER — Emergency Department (HOSPITAL_BASED_OUTPATIENT_CLINIC_OR_DEPARTMENT_OTHER)
Admission: EM | Admit: 2018-07-31 | Discharge: 2018-07-31 | Disposition: A | Discharge status: Home / Self Care | Payer: 59 | Attending: Emergency Medicine | Admitting: Emergency Medicine

## 2018-07-31 DIAGNOSIS — R21 Rash and other nonspecific skin eruption: Secondary | ICD-10-CM | POA: Diagnosis present

## 2018-07-31 DIAGNOSIS — Z79899 Other long term (current) drug therapy: Secondary | ICD-10-CM | POA: Insufficient documentation

## 2018-07-31 DIAGNOSIS — I10 Essential (primary) hypertension: Secondary | ICD-10-CM | POA: Diagnosis not present

## 2018-07-31 DIAGNOSIS — Z87891 Personal history of nicotine dependence: Secondary | ICD-10-CM | POA: Insufficient documentation

## 2018-07-31 DIAGNOSIS — B356 Tinea cruris: Secondary | ICD-10-CM | POA: Diagnosis not present

## 2018-07-31 LAB — CBG MONITORING, ED: Glucose-Capillary: 104 mg/dL — ABNORMAL HIGH (ref 70–99)

## 2018-07-31 MED ORDER — FLUCONAZOLE 200 MG PO TABS: 200.0000 mg | ORAL_TABLET | ORAL | 0 refills | Status: AC

## 2018-07-31 MED FILL — FLUCONAZOLE 200 MG TAB: 200 | 28 days supply | Qty: 4 | Fill #0

## 2018-07-31 NOTE — ED Notes (Signed)
ED Provider at bedside. 

## 2018-07-31 NOTE — Discharge Instructions (Signed)
Apply the Lotrimin cream that you have 1-2 times daily for 2 weeks.  Also the pill you take 1 pill weekly for a month.  If the rash continues to worsen despite the medication and trying to keep the area dry please follow-up with your doctor in 2 to 3 weeks

## 2018-07-31 NOTE — ED Provider Notes (Signed)
[facility] EMERGENCY DEPARTMENT Provider Note   CSN: [id number] Arrival date & time: 07/31/18  [id number]    History   Chief Complaint Chief Complaint  Patient presents with  . Rash    HPI Daniel Kelley is a 33 y.o. male.     Patient is a 33 year old male with a history of hypertension presenting today with complaint of rash in his groin area.  He states he noticed it on Friday which is 6 days prior to arrival and it is worsened and now involving his buttocks as well.  It is red, peeling and his skin is cracked.  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  He denies any urinary problems such as dysuria, frequency or urgency.  He has no penile discharge.  He has no fever, systemic symptoms such as general malaise or rashes anywhere else.  Denies a history of diabetes.  The history is provided by the patient.  Rash  Location:  Pelvis Pelvic rash location:  Groin and gluteal cleft Quality: dryness, peeling and redness   Severity:  Moderate Onset quality:  Gradual Duration:  6 days Timing:  Constant Progression:  Worsening Chronicity:  New Relieved by:  None tried Worsened by:  Nothing Ineffective treatments:  None tried Associated symptoms: no abdominal pain, no diarrhea and no fever     Past Medical History:  Diagnosis Date  . Hypertension   . Tonsillitis     Patient Active Problem List   Diagnosis Date Noted  . Multiple fractures of right hand bones 12/08/2017  . Multiple dislocations of fingers, open, initial encounter 12/08/2017  . Essential hypertension 08/23/2017  . Hyperlipidemia LDL goal <100 08/23/2017    Past Surgical History:  Procedure Laterality Date  . FRACTURE SURGERY  12/2017   right hand   . I&D EXTREMITY Right 12/08/2017   Procedure: IRRIGATION AND DEBRIDEMENT EXTREMITY, RIGHT HAND;  Surgeon: Dominica SeverinGramig, William, MD;  Location: [facility] OR;  Service: Orthopedics;  Laterality: Right;  . NO PAST  SURGERIES    . TONSILLECTOMY N/A 10/01/2015   Procedure: TONSILLECTOMY;  Surgeon: Drema Halonhristopher E Newman, MD;  Location: [facility];  Service: ENT;  Laterality: N/A;        Home Medications    Prior to Admission medications   Medication Sig Start Date End Date Taking? Authorizing Provider  amLODipine (NORVASC) 5 MG tablet TAKE 1 TABLET BY MOUTH EVERY DAY Patient not taking: Reported on 04/04/2018 03/11/18   Mike Gipouglas, Andre, FNP  fluticasone [facility](FLONASE) 50 MCG/ACT nasal spray Place 2 sprays into both nostrils daily. 11/23/17   Mike Gipouglas, Andre, FNP  losartan (COZAAR) 100 MG tablet Take 0.5 tablets (50 mg total) by mouth daily. 05/14/18   Mike Gipouglas, Andre, FNP  losartan-hydrochlorothiazide (HYZAAR) 100-12.5 MG tablet Take 1 tablet by mouth daily. 05/29/18   Mike Gipouglas, Andre, FNP    Family History No family history on file.  Social History Social History   Tobacco Use  . Smoking status: Former Smoker    Types: Cigarettes  . Smokeless tobacco: Former Engineer, waterUser  Substance Use Topics  . Alcohol use: Yes    Comment: couple times weekly   . Drug use: No     Allergies   Shrimp [shellfish allergy] and Penicillins   Review of Systems Review of Systems  Constitutional: Negative for fever.  Gastrointestinal: Negative for abdominal pain and diarrhea.  Skin: Positive for rash.  All other systems reviewed and are  negative.    Physical Exam Updated Vital Signs BP (!) 174/119 (BP Location: Right Arm)   Pulse 97   Resp 16   Ht 5\' 6"  (1.676 m)   Wt 104.3 kg   SpO2 98%   BMI 37.12 kg/m   Physical Exam Constitutional:      General: He is not in acute distress.    Appearance: He is obese.  HENT:     Head: Normocephalic and atraumatic.  Eyes:     Pupils: Pupils are equal, round, and reactive to light.  Cardiovascular:     Rate and Rhythm: Normal rate.  Pulmonary:     Effort: Pulmonary effort is normal.  Genitourinary:    Penis: Normal.      Scrotum/Testes: Normal.     Skin:    General: Skin is warm and dry.     Findings: Rash present.  Neurological:     General: No focal deficit present.     Mental Status: He is alert. Mental status is at baseline.  Psychiatric:        Mood and Affect: Mood normal.        Behavior: Behavior normal.        Thought Content: Thought content normal.      ED Treatments / Results  Labs (all labs ordered are listed, but only abnormal results are displayed) Labs Reviewed - No data to display  EKG None  Radiology No results found.  Procedures Procedures (including critical care time)  Medications Ordered in ED Medications - No data to display   Initial Impression / Assessment and Plan / ED Course  I have reviewed the triage vital signs and the nursing notes.  Pertinent labs & imaging results that were available during my care of the patient were reviewed by me and considered in my medical decision making (see chart for details).        Patient presenting today with a rash in the groin area most consistent with tinea cruris .  A fingerstick blood sugar 104 with low suspicion of diabetes as this is nonfasting.  Low suspicion for UTI, STI or abscess.  No signs of cellulitis or herpes.  Patient to continue using clotrimazole and also covered with diflucan.  Also noted today patient was hypertensive at 174/119.  Patient is currently taking a combo pill for his blood pressure.  He last had medication 2 days ago which is probably why he is hypertensive today. Encouraged him to take meds today and f/u with PCP.  Final Clinical Impressions(s) / ED Diagnoses   Final diagnoses:  Tinea cruris    ED Discharge Orders         Ordered    fluconazole (DIFLUCAN) 200 MG tablet  Weekly     07/31/18 8177           Gwyneth Sprout, MD 07/31/18 [phone]

## 2018-07-31 NOTE — ED Triage Notes (Signed)
Itchy red rash in groin area x5 days.  Has never had before.

## 2018-08-02 ENCOUNTER — Other Ambulatory Visit: Payer: Self-pay

## 2018-08-02 ENCOUNTER — Encounter (HOSPITAL_BASED_OUTPATIENT_CLINIC_OR_DEPARTMENT_OTHER): Payer: Self-pay | Admitting: *Deleted

## 2018-08-02 ENCOUNTER — Emergency Department (HOSPITAL_BASED_OUTPATIENT_CLINIC_OR_DEPARTMENT_OTHER)
Admission: EM | Admit: 2018-08-02 | Discharge: 2018-08-02 | Disposition: A | Discharge status: Home / Self Care | Payer: 59 | Attending: Emergency Medicine | Admitting: Emergency Medicine

## 2018-08-02 DIAGNOSIS — J020 Streptococcal pharyngitis: Secondary | ICD-10-CM | POA: Diagnosis not present

## 2018-08-02 DIAGNOSIS — Z79899 Other long term (current) drug therapy: Secondary | ICD-10-CM | POA: Diagnosis not present

## 2018-08-02 DIAGNOSIS — Z87891 Personal history of nicotine dependence: Secondary | ICD-10-CM | POA: Diagnosis not present

## 2018-08-02 DIAGNOSIS — I10 Essential (primary) hypertension: Secondary | ICD-10-CM | POA: Insufficient documentation

## 2018-08-02 DIAGNOSIS — J029 Acute pharyngitis, unspecified: Secondary | ICD-10-CM | POA: Diagnosis present

## 2018-08-02 LAB — GROUP A STREP BY PCR: Group A Strep by PCR: DETECTED — AB

## 2018-08-02 MED ORDER — DEXAMETHASONE SODIUM PHOSPHATE 10 MG/ML IJ SOLN
10.0000 mg | Freq: Once | INTRAMUSCULAR | Status: AC
  Administered 2018-08-02: 09:00:00 10 mg via INTRAMUSCULAR
  Filled 2018-08-02: qty 1

## 2018-08-02 MED ORDER — DIPHENHYDRAMINE HCL 25 MG PO CAPS
50.0000 mg | ORAL_CAPSULE | Freq: Once | ORAL | Status: DC
  Filled 2018-08-02: qty 2

## 2018-08-02 MED ORDER — AZITHROMYCIN 500 MG PO TABS: 500.0000 mg | ORAL_TABLET | Freq: Every day | ORAL | 0 refills | Status: DC

## 2018-08-02 MED FILL — AZITHROMYCIN 500 MG TABLET: 500 | 3 days supply | Qty: 3 | Fill #0

## 2018-08-02 NOTE — Discharge Instructions (Signed)
Your symptoms are likely due to strep pharyngitis and not your medication.  The next time you take your medication monitor your symptoms closely and so there is no confusion wait until your sore throat resolves before taking the fungal medication.  Stay well-hydrated.  Return for shortness of breath, difficulty with secretions or new concerns.

## 2018-08-02 NOTE — ED Provider Notes (Signed)
[facility] EMERGENCY DEPARTMENT Provider Note   CSN: [id number] Arrival date & time: 08/02/18  7048    History   Chief Complaint Chief Complaint  Patient presents with   Sore Throat    HPI Daniel Kelley is a 33 y.o. male.     Patient with history of high blood pressure compliant with losartan presents with sore throat and difficulty swallowing gradually worsening since Wednesday when he took his first fluconazole pill.  Patient was seen for Candida groin rash.  Patient has tried topical without relief.  No known other allergies.  Patient denies significant shortness of breath.  No fevers.     Past Medical History:  Diagnosis Date   Hypertension    Tonsillitis     Patient Active Problem List   Diagnosis Date Noted   Multiple fractures of right hand bones 12/08/2017   Multiple dislocations of fingers, open, initial encounter 12/08/2017   Essential hypertension 08/23/2017   Hyperlipidemia LDL goal <100 08/23/2017    Past Surgical History:  Procedure Laterality Date   FRACTURE SURGERY  12/2017   right hand    I&D EXTREMITY Right 12/08/2017   Procedure: IRRIGATION AND DEBRIDEMENT EXTREMITY, RIGHT HAND;  Surgeon: Dominica Severin, MD;  Location: [facility] OR;  Service: Orthopedics;  Laterality: Right;   NO PAST SURGERIES     TONSILLECTOMY N/A 10/01/2015   Procedure: TONSILLECTOMY;  Surgeon: Drema Halon, MD;  Location: [facility];  Service: ENT;  Laterality: N/A;        Home Medications    Prior to Admission medications   Medication Sig Start Date End Date Taking? Authorizing Provider  fluconazole (DIFLUCAN) 200 MG tablet Take 1 tablet (200 mg total) by mouth once a week for 4 doses. 07/31/18 08/22/18  Gwyneth Sprout, MD  fluticasone (FLONASE) 50 MCG/ACT nasal spray Place 2 sprays into both nostrils daily. 11/23/17   Mike Gip, FNP  losartan-hydrochlorothiazide (HYZAAR) 100-12.5 MG tablet Take 1 tablet by mouth daily. 05/29/18    Mike Gip, FNP    Family History History reviewed. No pertinent family history.  Social History Social History   Tobacco Use   Smoking status: Never Smoker   Smokeless tobacco: Former Engineer, water Use Topics   Alcohol use: Yes    Comment: 1 beer per might   Drug use: No     Allergies   Shrimp [shellfish allergy] and Penicillins   Review of Systems Review of Systems  Constitutional: Positive for appetite change. Negative for chills and fever.  HENT: Positive for sore throat. Negative for congestion.   Respiratory: Negative for shortness of breath.   Cardiovascular: Negative for chest pain.  Gastrointestinal: Negative for abdominal pain and vomiting.  Musculoskeletal: Negative for back pain, neck pain and neck stiffness.  Skin: Positive for rash.  Neurological: Negative for light-headedness and headaches.     Physical Exam Updated Vital Signs BP (!) 162/108 (BP Location: Right Arm)    Pulse (!) 107    Temp 98.2 F (36.8 C) (Oral)    Resp 14    SpO2 100%   Physical Exam Vitals signs and nursing note reviewed.  Constitutional:      Appearance: He is well-developed.  HENT:     Head: Normocephalic and atraumatic.     Comments: Posterior pharynx erythema without abscess or exudate, no stridor. Eyes:     General:        Right eye: No discharge.        Left eye: No  discharge.     Conjunctiva/sclera: Conjunctivae normal.  Neck:     Musculoskeletal: Normal range of motion and neck supple.     Trachea: No tracheal deviation.  Cardiovascular:     Rate and Rhythm: Normal rate and regular rhythm.  Pulmonary:     Effort: Pulmonary effort is normal.     Breath sounds: Normal breath sounds.  Abdominal:     General: There is no distension.     Palpations: Abdomen is soft.     Tenderness: There is no abdominal tenderness. There is no guarding.  Skin:    General: Skin is warm.     Findings: No rash.  Neurological:     Mental Status: He is alert and  oriented to person, place, and time.      ED Treatments / Results  Labs (all labs ordered are listed, but only abnormal results are displayed) Labs Reviewed  GROUP A STREP BY PCR    EKG None  Radiology No results found.  Procedures Procedures (including critical care time)  Medications Ordered in ED Medications  dexamethasone (DECADRON) injection 10 mg (10 mg Intramuscular Given 08/02/18 0836)     Initial Impression / Assessment and Plan / ED Course  I have reviewed the triage vital signs and the nursing notes.  Pertinent labs & imaging results that were available during my care of the patient were reviewed by me and considered in my medical decision making (see chart for details).       Patient presents with sore throat and possible allergic reaction to fluconazole.  The timing is consistent however discussed it may also be a virus/strep throat.  Strep throat test pending.  No signs of angioedema in the ER.  Patient declines Benadryl as he is driving.  Steroid shot given discussed Benadryl at home to see if it helps.Reasons to return shared with patient. Strep positive, unlikely allergic reaction from his medication.  Pt is not on acei.  Pt will need outpt fup for high blood pressure.  Results and differential diagnosis were discussed with the patient/parent/guardian. Xrays were independently reviewed by myself.  Close follow up outpatient was discussed, comfortable with the plan.   Medications  dexamethasone (DECADRON) injection 10 mg (10 mg Intramuscular Given 08/02/18 0836)    Vitals:   08/02/18 0742  BP: (!) 162/108  Pulse: (!) 107  Resp: 14  Temp: 98.2 F (36.8 C)  TempSrc: Oral  SpO2: 100%    Final diagnoses:  Strep pharyngitis     Final Clinical Impressions(s) / ED Diagnoses   Final diagnoses:  Sore throat    ED Discharge Orders    None       Blane OharaZavitz, Thomas Mabry, MD 08/02/18 [phone]

## 2018-08-02 NOTE — ED Triage Notes (Signed)
Pt reports sore throat x yesterday morning. Denies any fevers, pain with swallowing, pt feels this is an allergic reaction to the diflucan for jock itch he was rx here earlier this week, first dose Wednesday. No other swelling or rash reported.

## 2018-11-13 ENCOUNTER — Encounter (HOSPITAL_COMMUNITY): Payer: Self-pay

## 2018-11-13 ENCOUNTER — Encounter (HOSPITAL_COMMUNITY): Payer: Self-pay | Admitting: *Deleted

## 2018-11-14 ENCOUNTER — Other Ambulatory Visit: Payer: Self-pay

## 2018-11-14 ENCOUNTER — Encounter: Payer: Self-pay | Admitting: Family Medicine

## 2018-11-14 ENCOUNTER — Ambulatory Visit (INDEPENDENT_AMBULATORY_CARE_PROVIDER_SITE_OTHER): Payer: Self-pay | Admitting: Family Medicine

## 2018-11-14 VITALS — BP 148/100 | HR 96 | Temp 98.4°F | Resp 16 | Ht 66.0 in | Wt 230.0 lb

## 2018-11-14 DIAGNOSIS — I10 Essential (primary) hypertension: Secondary | ICD-10-CM

## 2018-11-14 LAB — POCT URINALYSIS DIPSTICK
Bilirubin, UA: NEGATIVE
Blood, UA: NEGATIVE
Glucose, UA: NEGATIVE
Ketones, UA: NEGATIVE
Leukocytes, UA: NEGATIVE
Nitrite, UA: NEGATIVE
Protein, UA: NEGATIVE
Spec Grav, UA: 1.02 (ref 1.010–1.025)
Urobilinogen, UA: 0.2 E.U./dL
pH, UA: 6.5 (ref 5.0–8.0)

## 2018-11-14 MED ORDER — CLONIDINE HCL 0.1 MG PO TABS
0.2000 mg | ORAL_TABLET | Freq: Once | ORAL | Status: AC
  Administered 2018-11-14: 0.2 mg via ORAL

## 2018-11-14 MED ORDER — LOSARTAN POTASSIUM-HCTZ 50-12.5 MG PO TABS: 1.0000 | ORAL_TABLET | Freq: Two times a day (BID) | ORAL | 3 refills | Status: DC

## 2018-11-14 MED ORDER — AMLODIPINE BESYLATE 5 MG PO TABS: 5.0000 mg | ORAL_TABLET | Freq: Every day | ORAL | 3 refills | Status: DC

## 2018-11-14 MED ORDER — DILTIAZEM HCL ER 120 MG PO CP24: 120.0000 mg | ORAL_CAPSULE | Freq: Every day | ORAL | 2 refills | Status: DC

## 2018-11-14 NOTE — Progress Notes (Signed)
[facility]   Progress Note: General Provider: Lanae Boast, FNP  SUBJECTIVE:   Daniel Kelley is a 33 y.o. male who  has a past medical history of Hypertension and Tonsillitis.. Patient presents today for Hypertension and Tachycardia   Patient reports having mild chest discomfort and palpitations this morning. He reports having a high BP reading of 210/120. He states that he is compliant with his medications and has decreased his sodium intake. He does not endorse regular physical activity. He states that he is taking losartan 50 mg po BID. He is not taking Hyzaar as noted in the chart. This was confirmed with [facility] pharmacy staff. He states that when he took amlodipine, he felt overly fatigued. He is not currently having chest pain, SOB, dizziness or leg swelling.   Review of Systems  Constitutional: Negative.   HENT: Negative.   Eyes: Negative.   Respiratory: Negative.   Cardiovascular: Negative.   Gastrointestinal: Negative.   Genitourinary: Negative.   Musculoskeletal: Negative.   Skin: Negative.   Neurological: Negative.   Psychiatric/Behavioral: Negative.      OBJECTIVE: BP (!) 148/100 Comment: manually  Pulse 96   Temp 98.4 F (36.9 C) (Oral)   Resp 16   Ht 5\' 6"  (1.676 m)   Wt 230 lb (104.3 kg)   SpO2 100%   BMI 37.12 kg/m   Wt Readings from Last 3 Encounters:  11/14/18 230 lb (104.3 kg)  07/31/18 230 lb (104.3 kg)  02/25/18 234 lb (106.1 kg)     Physical Exam Vitals signs and nursing note reviewed.  Constitutional:      General: He is not in acute distress.    Appearance: He is well-developed.  HENT:     Head: Normocephalic and atraumatic.  Eyes:     Conjunctiva/sclera: Conjunctivae normal.     Pupils: Pupils are equal, round, and reactive to light.  Neck:     Musculoskeletal: Normal range of motion.  Cardiovascular:     Rate and Rhythm: Normal rate and regular rhythm.     Heart sounds: Normal heart  sounds.  Pulmonary:     Effort: Pulmonary effort is normal. No respiratory distress.     Breath sounds: Normal breath sounds.  Abdominal:     General: Bowel sounds are normal. There is no distension.     Palpations: Abdomen is soft.  Musculoskeletal: Normal range of motion.  Skin:    General: Skin is warm and dry.  Neurological:     Mental Status: He is alert and oriented to person, place, and time.  Psychiatric:        Behavior: Behavior normal.        Thought Content: Thought content normal.     ASSESSMENT/PLAN:   1. Essential hypertension Spoke with the pharmacist at [facility]. Patient is taking losartan 50 mg BID. No HCTZ. He prefers BID dosing. Will order hyzaar as noted below. Add diltiazem and ASA 81 mg po daily.  - cloNIDine (CATAPRES) tablet 0.2 mg - Urinalysis Dipstick - diltiazem (DILACOR XR) 120 MG 24 hr capsule; Take 1 capsule (120 mg total) by mouth daily.  Dispense: 30 capsule; Refill: 2 - losartan-hydrochlorothiazide (HYZAAR) 50-12.5 MG tablet; Take 1 tablet by mouth 2 (two) times daily.  Dispense: 180 tablet; Refill: 3  Reviewed ECG with Dr. Doreene Burke, MD. No acute abnormalities noted.     Return in about 1 week (around 11/21/2018) for HTN.   Time Spent: 25 minutes face-to-face with this  patient discussing problems, treatments, and answering patient's questions.   The patient was given clear instructions to go to ER or return to medical center if symptoms do not improve, worsen or new problems develop. The patient verbalized understanding and agreed with plan of care.   Ms. [name], FNP-BC [facility] [address]  [place], [place] [address] [phone]

## 2018-11-14 NOTE — Patient Instructions (Addendum)
I started you on Diltiazem once a day. You will take this along with the losartan-hctz. The pill that you have been taking, did not include the diuretic, HCTZ.  You will need to take a baby aspirin once a day (81mg ).   Hypertension, Adult Hypertension is another name for high blood pressure. High blood pressure forces your heart to work harder to pump blood. This can cause problems over time. There are two numbers in a blood pressure reading. There is a top number (systolic) over a bottom number (diastolic). It is best to have a blood pressure that is below 120/80. Healthy choices can help lower your blood pressure, or you may need medicine to help lower it. What are the causes? The cause of this condition is not known. Some conditions may be related to high blood pressure. What increases the risk?  Smoking.  Having type 2 diabetes mellitus, high cholesterol, or both.  Not getting enough exercise or physical activity.  Being overweight.  Having too much fat, sugar, calories, or salt (sodium) in your diet.  Drinking too much alcohol.  Having long-term (chronic) kidney disease.  Having a family history of high blood pressure.  Age. Risk increases with age.  Race. You may be at higher risk if you are African American.  Gender. Men are at higher risk than women before age 33. After age 5, women are at higher risk than men.  Having obstructive sleep apnea.  Stress. What are the signs or symptoms?  High blood pressure may not cause symptoms. Very high blood pressure (hypertensive crisis) may cause: ? Headache. ? Feelings of worry or nervousness (anxiety). ? Shortness of breath. ? Nosebleed. ? A feeling of being sick to your stomach (nausea). ? Throwing up (vomiting). ? Changes in how you see. ? Very bad chest pain. ? Seizures. How is this treated?  This condition is treated by making healthy lifestyle changes, such as: ? Eating healthy foods. ? Exercising more. ?  Drinking less alcohol.  Your health care provider may prescribe medicine if lifestyle changes are not enough to get your blood pressure under control, and if: ? Your top number is above 130. ? Your bottom number is above 80.  Your personal target blood pressure may vary. Follow these instructions at home: Eating and drinking   If told, follow the DASH eating plan. To follow this plan: ? Fill one half of your plate at each meal with fruits and vegetables. ? Fill one fourth of your plate at each meal with whole grains. Whole grains include whole-wheat pasta, brown rice, and whole-grain bread. ? Eat or drink low-fat dairy products, such as skim milk or low-fat yogurt. ? Fill one fourth of your plate at each meal with low-fat (lean) proteins. Low-fat proteins include fish, chicken without skin, eggs, beans, and tofu. ? Avoid fatty meat, cured and processed meat, or chicken with skin. ? Avoid pre-made or processed food.  Eat less than 1,500 mg of salt each day.  Do not drink alcohol if: ? Your doctor tells you not to drink. ? You are pregnant, may be pregnant, or are planning to become pregnant.  If you drink alcohol: ? Limit how much you use to:  0-1 drink a day for women.  0-2 drinks a day for men. ? Be aware of how much alcohol is in your drink. In the [place] one drink equals one 12 oz bottle of beer (355 mL), one 5 oz glass of wine (148 mL), or one  1 oz glass of hard liquor (44 mL). Lifestyle   Work with your doctor to stay at a healthy weight or to lose weight. Ask your doctor what the best weight is for you.  Get at least 30 minutes of exercise most days of the week. This may include walking, swimming, or biking.  Get at least 30 minutes of exercise that strengthens your muscles (resistance exercise) at least 3 days a week. This may include lifting weights or doing Pilates.  Do not use any products that contain nicotine or tobacco, such as cigarettes, e-cigarettes, and  chewing tobacco. If you need help quitting, ask your doctor.  Check your blood pressure at home as told by your doctor.  Keep all follow-up visits as told by your doctor. This is important. Medicines  Take over-the-counter and prescription medicines only as told by your doctor. Follow directions carefully.  Do not skip doses of blood pressure medicine. The medicine does not work as well if you skip doses. Skipping doses also puts you at risk for problems.  Ask your doctor about side effects or reactions to medicines that you should watch for. Contact a doctor if you:  Think you are having a reaction to the medicine you are taking.  Have headaches that keep coming back (recurring).  Feel dizzy.  Have swelling in your ankles.  Have trouble with your vision. Get help right away if you:  Get a very bad headache.  Start to feel mixed up (confused).  Feel weak or numb.  Feel faint.  Have very bad pain in your: ? Chest. ? Belly (abdomen).  Throw up more than once.  Have trouble breathing. Summary  Hypertension is another name for high blood pressure.  High blood pressure forces your heart to work harder to pump blood.  For most people, a normal blood pressure is less than 120/80.  Making healthy choices can help lower blood pressure. If your blood pressure does not get lower with healthy choices, you may need to take medicine. This information is not intended to replace advice given to you by your health care provider. Make sure you discuss any questions you have with your health care provider. Document Released: 08/09/2007 Document Revised: 10/31/2017 Document Reviewed: 10/31/2017 Elsevier Patient Education  2020 [employer]

## 2018-11-21 ENCOUNTER — Other Ambulatory Visit: Payer: Self-pay

## 2018-11-21 ENCOUNTER — Ambulatory Visit: Payer: 59 | Admitting: Family Medicine

## 2018-11-21 VITALS — BP 144/90 | HR 84

## 2018-11-21 DIAGNOSIS — Z538 Procedure and treatment not carried out for other reasons: Secondary | ICD-10-CM

## 2019-01-11 ENCOUNTER — Other Ambulatory Visit: Payer: Self-pay | Admitting: Family Medicine

## 2019-01-11 DIAGNOSIS — H6642 Suppurative otitis media, unspecified, left ear: Secondary | ICD-10-CM

## 2019-01-11 DIAGNOSIS — H66002 Acute suppurative otitis media without spontaneous rupture of ear drum, left ear: Secondary | ICD-10-CM

## 2019-01-11 MED ORDER — DOXYCYCLINE HYCLATE 100 MG PO TABS: 100.0000 mg | ORAL_TABLET | Freq: Two times a day (BID) | ORAL | 0 refills | Status: AC

## 2019-02-20 ENCOUNTER — Other Ambulatory Visit: Payer: Self-pay | Admitting: Family Medicine

## 2019-02-20 DIAGNOSIS — I10 Essential (primary) hypertension: Secondary | ICD-10-CM

## 2019-02-20 NOTE — Telephone Encounter (Signed)
Patient need to call the office for an office visit. For future refills.

## 2019-07-04 ENCOUNTER — Other Ambulatory Visit: Payer: Self-pay | Admitting: Family Medicine

## 2019-09-17 ENCOUNTER — Ambulatory Visit (INDEPENDENT_AMBULATORY_CARE_PROVIDER_SITE_OTHER): Payer: 59 | Admitting: Nurse Practitioner

## 2019-09-17 ENCOUNTER — Other Ambulatory Visit: Payer: Self-pay

## 2019-09-17 VITALS — BP 160/103 | HR 86

## 2019-09-17 DIAGNOSIS — R0683 Snoring: Secondary | ICD-10-CM | POA: Diagnosis not present

## 2019-09-17 DIAGNOSIS — R29818 Other symptoms and signs involving the nervous system: Secondary | ICD-10-CM

## 2019-09-17 DIAGNOSIS — I10 Essential (primary) hypertension: Secondary | ICD-10-CM

## 2019-09-17 DIAGNOSIS — R5383 Other fatigue: Secondary | ICD-10-CM

## 2019-09-17 DIAGNOSIS — Z0000 Encounter for general adult medical examination without abnormal findings: Secondary | ICD-10-CM

## 2019-09-17 LAB — POCT GLYCOSYLATED HEMOGLOBIN (HGB A1C)
HbA1c POC (<> result, manual entry): 5.5 % (ref 4.0–5.6)
HbA1c, POC (controlled diabetic range): 5.5 % (ref 0.0–7.0)
HbA1c, POC (prediabetic range): 5.5 % — AB (ref 5.7–6.4)
Hemoglobin A1C: 5.5 % (ref 4.0–5.6)

## 2019-09-17 LAB — GLUCOSE, POCT (MANUAL RESULT ENTRY): POC Glucose: 118 mg/dl — AB (ref 70–99)

## 2019-09-17 MED ORDER — CLONIDINE HCL 0.1 MG PO TABS
0.1000 mg | ORAL_TABLET | Freq: Once | ORAL | Status: AC
  Administered 2019-09-17: 0.2 mg via ORAL

## 2019-09-17 NOTE — Patient Instructions (Signed)
   Managing Your Hypertension Hypertension is commonly called high blood pressure. This is when the force of your blood pressing against the walls of your arteries is too strong. Arteries are blood vessels that carry blood from your heart throughout your body. Hypertension forces the heart to work harder to pump blood, and may cause the arteries to become narrow or stiff. Having untreated or uncontrolled hypertension can cause heart attack, stroke, kidney disease, and other problems. What are blood pressure readings? A blood pressure reading consists of a higher number over a lower number. Ideally, your blood pressure should be below 120/80. The first ("top") number is called the systolic pressure. It is a measure of the pressure in your arteries as your heart beats. The second ("bottom") number is called the diastolic pressure. It is a measure of the pressure in your arteries as the heart relaxes. What does my blood pressure reading mean? Blood pressure is classified into four stages. Based on your blood pressure reading, your health care provider may use the following stages to determine what type of treatment you need, if any. Systolic pressure and diastolic pressure are measured in a unit called mm Hg. Normal  Systolic pressure: below 120.  Diastolic pressure: below 80. Elevated  Systolic pressure: 120-129.  Diastolic pressure: below 80. Hypertension stage 1  Systolic pressure: 130-139.  Diastolic pressure: 80-89. Hypertension stage 2  Systolic pressure: 140 or above.  Diastolic pressure: 90 or above. What health risks are associated with hypertension? Managing your hypertension is an important responsibility. Uncontrolled hypertension can lead to:  A heart attack.  A stroke.  A weakened blood vessel (aneurysm).  Heart failure.  Kidney damage.  Eye damage.  Metabolic syndrome.  Memory and concentration problems. What changes can I make to manage my  hypertension? Hypertension can be managed by making lifestyle changes and possibly by taking medicines. Your health care provider will help you make a plan to bring your blood pressure within a normal range. Eating and drinking   Eat a diet that is high in fiber and potassium, and low in salt (sodium), added sugar, and fat. An example eating plan is called the DASH (Dietary Approaches to Stop Hypertension) diet. To eat this way: ? Eat plenty of fresh fruits and vegetables. Try to fill half of your plate at each meal with fruits and vegetables. ? Eat whole grains, such as whole wheat pasta, brown rice, or whole grain bread. Fill about one quarter of your plate with whole grains. ? Eat low-fat diary products. ? Avoid fatty cuts of meat, processed or cured meats, and poultry with skin. Fill about one quarter of your plate with lean proteins such as fish, chicken without skin, beans, eggs, and tofu. ? Avoid premade and processed foods. These tend to be higher in sodium, added sugar, and fat.  Reduce your daily sodium intake. Most people with hypertension should eat less than 1,500 mg of sodium a day.  Limit alcohol intake to no more than 1 drink a day for nonpregnant women and 2 drinks a day for men. One drink equals 12 oz of beer, 5 oz of wine, or 1 oz of hard liquor. Lifestyle  Work with your health care provider to maintain a healthy body weight, or to lose weight. Ask what an ideal weight is for you.  Get at least 30 minutes of exercise that causes your heart to beat faster (aerobic exercise) most days of the week. Activities may include walking, swimming, or biking.    Include exercise to strengthen your muscles (resistance exercise), such as weight lifting, as part of your weekly exercise routine. Try to do these types of exercises for 30 minutes at least 3 days a week.  Do not use any products that contain nicotine or tobacco, such as cigarettes and e-cigarettes. If you need help quitting,  ask your health care provider.  Control any long-term (chronic) conditions you have, such as high cholesterol or diabetes. Monitoring  Monitor your blood pressure at home as told by your health care provider. Your personal target blood pressure may vary depending on your medical conditions, your age, and other factors.  Have your blood pressure checked regularly, as often as told by your health care provider. Working with your health care provider  Review all the medicines you take with your health care provider because there may be side effects or interactions.  Talk with your health care provider about your diet, exercise habits, and other lifestyle factors that may be contributing to hypertension.  Visit your health care provider regularly. Your health care provider can help you create and adjust your plan for managing hypertension. Will I need medicine to control my blood pressure? Your health care provider may prescribe medicine if lifestyle changes are not enough to get your blood pressure under control, and if:  Your systolic blood pressure is 130 or higher.  Your diastolic blood pressure is 80 or higher. Take medicines only as told by your health care provider. Follow the directions carefully. Blood pressure medicines must be taken as prescribed. The medicine does not work as well when you skip doses. Skipping doses also puts you at risk for problems. Contact a health care provider if:  You think you are having a reaction to medicines you have taken.  You have repeated (recurrent) headaches.  You feel dizzy.  You have swelling in your ankles.  You have trouble with your vision. Get help right away if:  You develop a severe headache or confusion.  You have unusual weakness or numbness, or you feel faint.  You have severe pain in your chest or abdomen.  You vomit repeatedly.  You have trouble breathing. Summary  Hypertension is when the force of blood pumping  through your arteries is too strong. If this condition is not controlled, it may put you at risk for serious complications.  Your personal target blood pressure may vary depending on your medical conditions, your age, and other factors. For most people, a normal blood pressure is less than 120/80.  Hypertension is managed by lifestyle changes, medicines, or both. Lifestyle changes include weight loss, eating a healthy, low-sodium diet, exercising more, and limiting alcohol. This information is not intended to replace advice given to you by your health care provider. Make sure you discuss any questions you have with your health care provider. Document Revised: 06/14/2018 Document Reviewed: 01/19/2016 Elsevier Patient Education  2020 Elsevier Inc.  

## 2019-09-17 NOTE — Progress Notes (Signed)
   [facility] Patient [facility] [address] [place], [place]  [address] Phone:  [phone]   Fax:  [phone] Duplicate

## 2019-09-17 NOTE — Progress Notes (Signed)
Acute Office Visit  Subjective:    Patient ID: Daniel Kelley, male    DOB: 1985/09/20, 34 y.o.   MRN: [id number]  Patient in today for a BP check. Has not been seen by a provider since September 2020.   HPI Patient is in today for an acute visit.  Patient is a walk-in for blood pressure check.  has a past medical history of Hypertension and Tonsillitis.   Hypertension Patient is here for follow-up of elevated blood pressure.  He is a Naval architect.  He admits his home daily but drives over 600 miles.  He has noticed that he is having to 2 hours into his shift.  He feels like it is related to his medications. He is not exercising and is not adherent to a low-salt diet. Blood pressure is not well controlled at home. Cardiac symptoms: fatigue. Patient denies chest pain, dyspnea, irregular heart beat, lower extremity edema, palpitations and syncope. Cardiovascular risk factors: hypertension, male gender, obesity (BMI >= 30 kg/m2) and sedentary lifestyle. Use of agents associated with hypertension: none. History of target organ damage: none   Past Medical History:  Diagnosis Date  . Hypertension   . Tonsillitis     Past Surgical History:  Procedure Laterality Date  . FRACTURE SURGERY  12/2017   right hand   . I & D EXTREMITY Right 12/08/2017   Procedure: IRRIGATION AND DEBRIDEMENT EXTREMITY, RIGHT HAND;  Surgeon: Dominica Severin, MD;  Location: [facility] OR;  Service: Orthopedics;  Laterality: Right;  . NO PAST SURGERIES    . TONSILLECTOMY N/A 10/01/2015   Procedure: TONSILLECTOMY;  Surgeon: Drema Halon, MD;  Location: [facility];  Service: ENT;  Laterality: N/A;    No family history on file.  Social History   Socioeconomic History  . Marital status: Single    Spouse name: Not on file  . Number of children: Not on file  . Years of education: Not on file  . Highest education level: Not on file  Occupational History  . Not on file  Tobacco Use  . Smoking status:  Never Smoker  . Smokeless tobacco: Former Clinical biochemist  . Vaping Use: Never used  Substance and Sexual Activity  . Alcohol use: Yes    Comment: 1 beer per might  . Drug use: No  . Sexual activity: Not on file  Other Topics Concern  . Not on file  Social History Narrative  . Not on file   Social Determinants of Health   Financial Resource Strain:   . Difficulty of Paying Living Expenses:   Food Insecurity:   . Worried About Programme researcher, broadcasting/film/video in the Last Year:   . Barista in the Last Year:   Transportation Needs:   . Freight forwarder (Medical):   Marland Kitchen Lack of Transportation (Non-Medical):   Physical Activity:   . Days of Exercise per Week:   . Minutes of Exercise per Session:   Stress:   . Feeling of Stress :   Social Connections:   . Frequency of Communication with Friends and Family:   . Frequency of Social Gatherings with Friends and Family:   . Attends Religious Services:   . Active Member of Clubs or Organizations:   . Attends Banker Meetings:   Marland Kitchen Marital Status:   Intimate Partner Violence:   . Fear of Current or Ex-Partner:   . Emotionally Abused:   Marland Kitchen Physically Abused:   .  Sexually Abused:     Outpatient Medications Prior to Visit  Medication Sig Dispense Refill  . diltiazem (CARDIZEM CD) 120 MG 24 hr capsule TAKE 1 CAPSULE BY MOUTH EVERY DAY 90 capsule 1  . losartan-hydrochlorothiazide (HYZAAR) 50-12.5 MG tablet Take 1 tablet by mouth 2 (two) times daily. 180 tablet 3   No facility-administered medications prior to visit.    Allergies  Allergen Reactions  . Shrimp [Shellfish Allergy] Hives, Itching, Swelling and Rash    Not anaphalaxis  . Penicillins     Review of Systems  All other systems reviewed and are negative.      Objective:    Physical Exam Constitutional:      General: He is not in acute distress.    Appearance: He is obese. He is not ill-appearing or toxic-appearing.  Cardiovascular:     Rate and  Rhythm: Normal rate.     Pulses: Normal pulses.  Pulmonary:     Effort: Pulmonary effort is normal.  Abdominal:     Comments: Increased abdominal girth  Musculoskeletal:        General: Normal range of motion.  Skin:    General: Skin is warm.  Neurological:     General: No focal deficit present.     Mental Status: He is alert and oriented to person, place, and time.  Psychiatric:        Mood and Affect: Mood normal.        Behavior: Behavior normal.        Thought Content: Thought content normal.        Judgment: Judgment normal.     BP (!) 160/103   Pulse 86  Wt Readings from Last 3 Encounters:  11/14/18 230 lb (104.3 kg)  07/31/18 230 lb (104.3 kg)  02/25/18 234 lb (106.1 kg)    Health Maintenance Due  Topic Date Due  . Hepatitis C Screening  Never done  . COVID-19 Vaccine (1) Never done    There are no preventive care reminders to display for this patient.   No results found for: TSH Lab Results  Component Value Date   WBC 6.2 12/08/2017   HGB 13.3 12/08/2017   HCT 40.0 12/08/2017   MCV 93.0 12/08/2017   PLT 244 12/08/2017   Lab Results  Component Value Date   NA 135 09/17/2019   K 4.1 09/17/2019   CO2 22 12/08/2017   GLUCOSE 107 (H) 09/17/2019   BUN 12 09/17/2019   CREATININE 1.00 09/17/2019   BILITOT 0.4 09/17/2019   ALKPHOS 59 09/17/2019   AST 41 (H) 09/17/2019   ALT 57 (H) 11/23/2017   PROT 7.4 09/17/2019   ALBUMIN 4.6 09/17/2019   CALCIUM 9.8 09/17/2019   ANIONGAP 14 12/08/2017   No results found for: CHOL No results found for: HDL No results found for: LDLCALC No results found for: TRIG No results found for: CHOLHDL Lab Results  Component Value Date   HGBA1C 5.5 09/17/2019   HGBA1C 5.5 09/17/2019   HGBA1C 5.5 (A) 09/17/2019   HGBA1C 5.5 09/17/2019       Assessment & Plan:   Problem List Items Addressed This Visit      Cardiovascular and Mediastinum   Essential hypertension - Primary   Relevant Orders   Comp. Metabolic Panel  (12) (Completed) Long discussion with patient related to medications and side effects Discussed with patient the risk of noncompliance with antihypertensive agents Encouraged lifestyle modification with dietary changes and regular daily exercise Encourage patient to  monitor blood pressure daily and bring in home monitor  restart medications as directed 3-week follow-up     Other   Snoring   Suspected sleep apnea  Encourage patient to consider having a sleep study    Other Visit Diagnoses    Healthcare maintenance       Relevant Orders   Glucose (CBG) (Completed)   HgB A1c (Completed)   Fatigue, unspecified type           Meds ordered this encounter  Medications  . cloNIDine (CATAPRES) tablet 0.1 mg  . cloNIDine (CATAPRES) tablet 0.1 mg     Barbette Merino, NP

## 2019-09-18 ENCOUNTER — Other Ambulatory Visit: Payer: Self-pay | Admitting: Nurse Practitioner

## 2019-09-18 ENCOUNTER — Telehealth: Payer: Self-pay | Admitting: Nurse Practitioner

## 2019-09-18 LAB — COMP. METABOLIC PANEL (12)
AST: 41 IU/L — ABNORMAL HIGH (ref 0–40)
Albumin/Globulin Ratio: 1.6 (ref 1.2–2.2)
Albumin: 4.6 g/dL (ref 4.0–5.0)
Alkaline Phosphatase: 59 IU/L (ref 48–121)
BUN/Creatinine Ratio: 12 (ref 9–20)
BUN: 12 mg/dL (ref 6–20)
Bilirubin Total: 0.4 mg/dL (ref 0.0–1.2)
Calcium: 9.8 mg/dL (ref 8.7–10.2)
Chloride: 100 mmol/L (ref 96–106)
Creatinine, Ser: 1 mg/dL (ref 0.76–1.27)
GFR calc Af Amer: 112 mL/min/{1.73_m2} (ref >59)
GFR calc non Af Amer: 97 mL/min/{1.73_m2} (ref >59)
Globulin, Total: 2.8 g/dL (ref 1.5–4.5)
Glucose: 107 mg/dL — ABNORMAL HIGH (ref 65–99)
Potassium: 4.1 mmol/L (ref 3.5–5.2)
Sodium: 135 mmol/L (ref 134–144)
Total Protein: 7.4 g/dL (ref 6.0–8.5)

## 2019-09-18 MED ORDER — AMLODIPINE BESYLATE 10 MG PO TABS: 10.0000 mg | ORAL_TABLET | Freq: Every day | ORAL | 0 refills | Status: DC

## 2019-09-18 NOTE — Telephone Encounter (Signed)
Patient was notified. Verbally understood

## 2019-09-18 NOTE — Telephone Encounter (Signed)
Patient stated to please send in Amlodipine to [facility] in [place] on [address]. Thank you.

## 2019-09-18 NOTE — Progress Notes (Signed)
° °  [facility] Patient [facility] [address] [place], [place]  [address] Phone:  [phone]   Fax:  [phone]  Change in BP medication of cardizem CD 120mg  back to Amlodipine 10mg  qd with Hyzaar 50/12.5mg  BID FU as scheduled

## 2019-09-18 NOTE — Telephone Encounter (Signed)
Amlodipine 10mg  for 30 days Sent  He can hold the Cardizem but he needs to continue with the losartan/HCTZ (Hyzzar) as directed    FU as scheduled

## 2019-09-18 NOTE — Telephone Encounter (Signed)
Please call him. We can trial this and see how he does. He needs to bring in a list of blood pressure readings when he comes in for follow up.  He needs to make sure he is compliant with whatever he is prescribed.  He also needs to bring in BP monitor thanks

## 2019-09-18 NOTE — Telephone Encounter (Signed)
Pt would like to know your thoughts on prescribing  Amlodipine. Please give pt a call back

## 2019-09-20 DIAGNOSIS — R0683 Snoring: Secondary | ICD-10-CM | POA: Insufficient documentation

## 2019-09-20 DIAGNOSIS — R29818 Other symptoms and signs involving the nervous system: Secondary | ICD-10-CM | POA: Insufficient documentation

## 2019-09-30 IMAGING — CR DG HAND COMPLETE 3+V*R*
3 series · 3 of 3 positions shown · non-contrast
Comparison: None.

CLINICAL DATA: Fall off hover board, laceration to third and fourth
digit.

EXAM:
RIGHT HAND - COMPLETE 3+ VIEW

[hand pa]
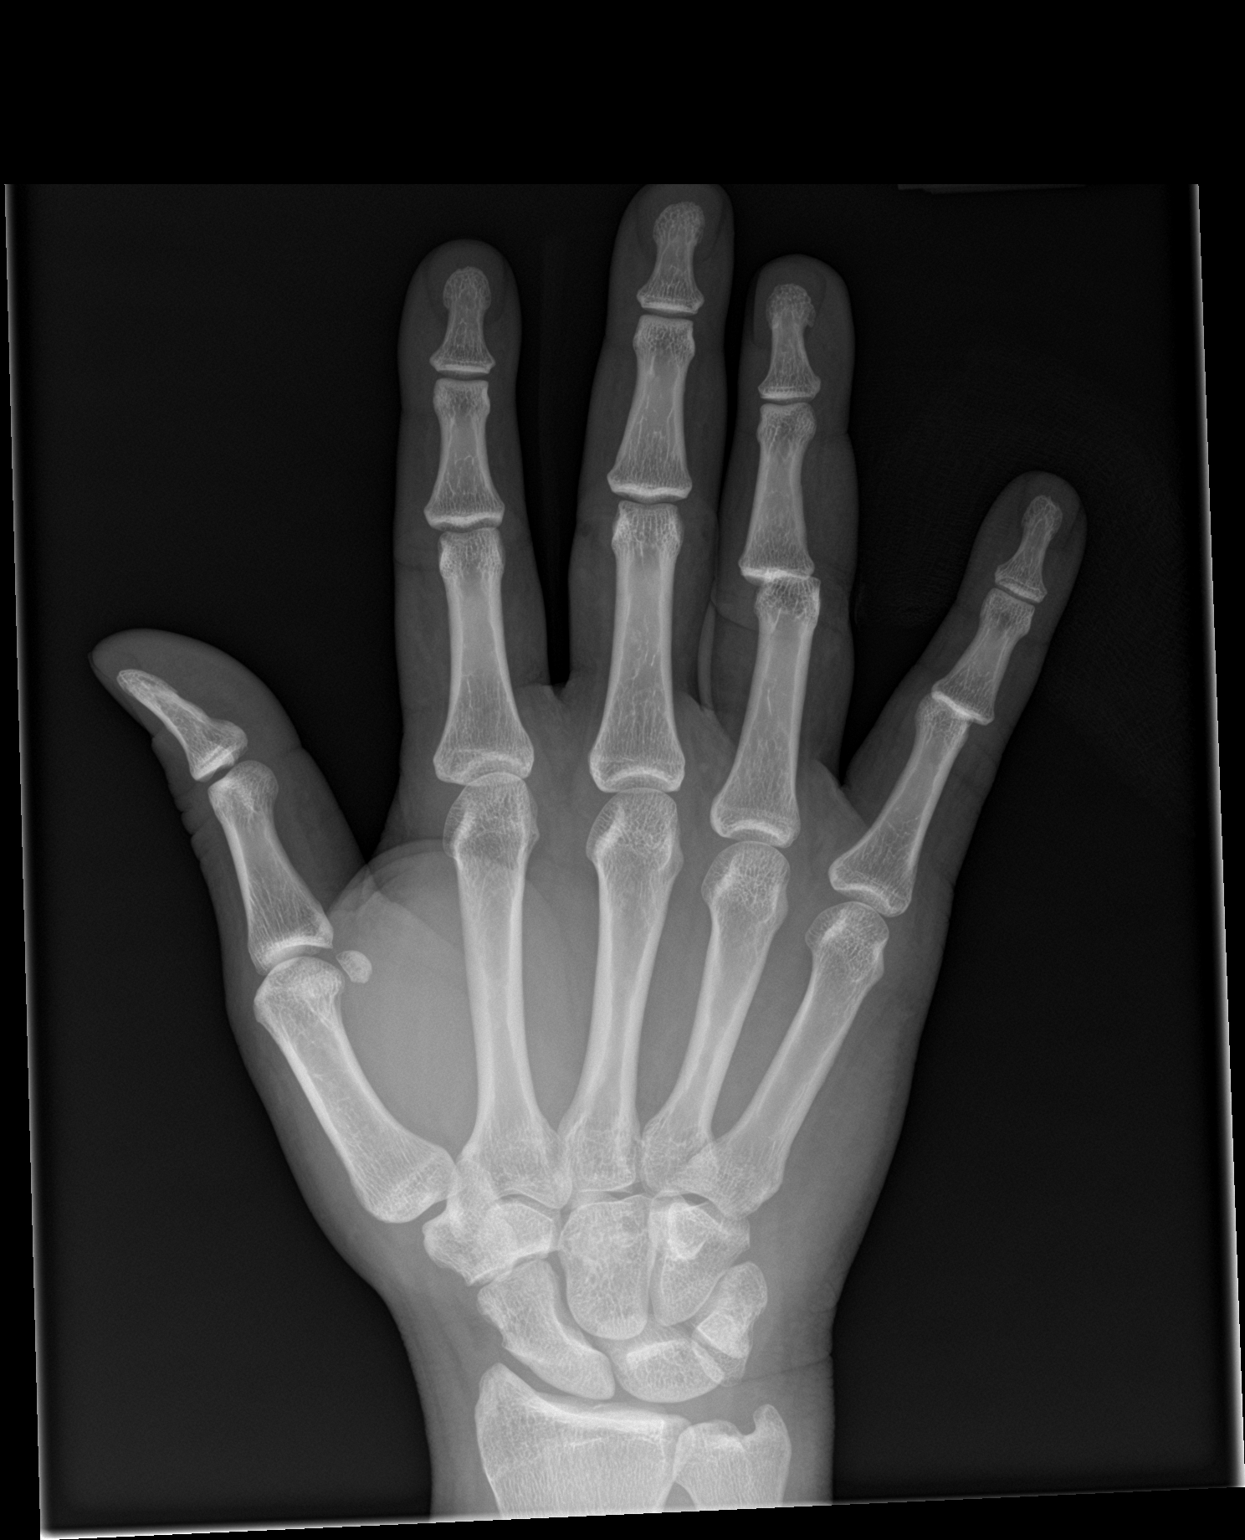

[hand obl]
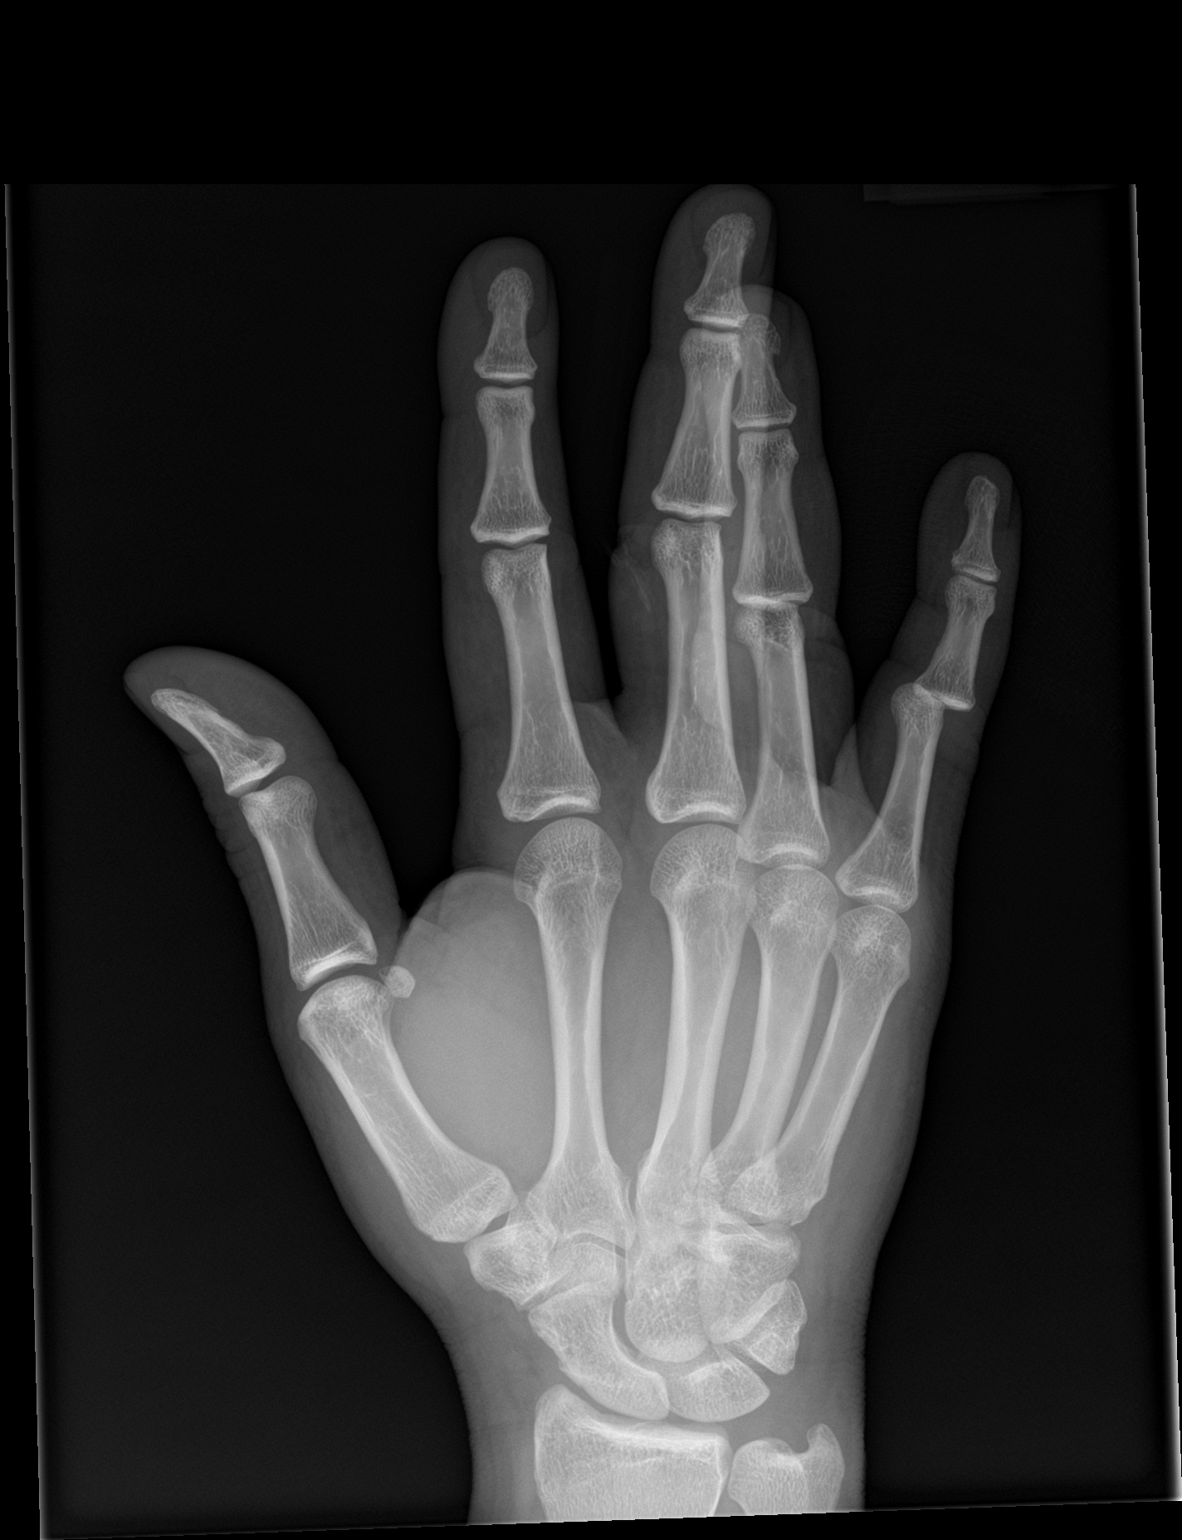

[hand lat]
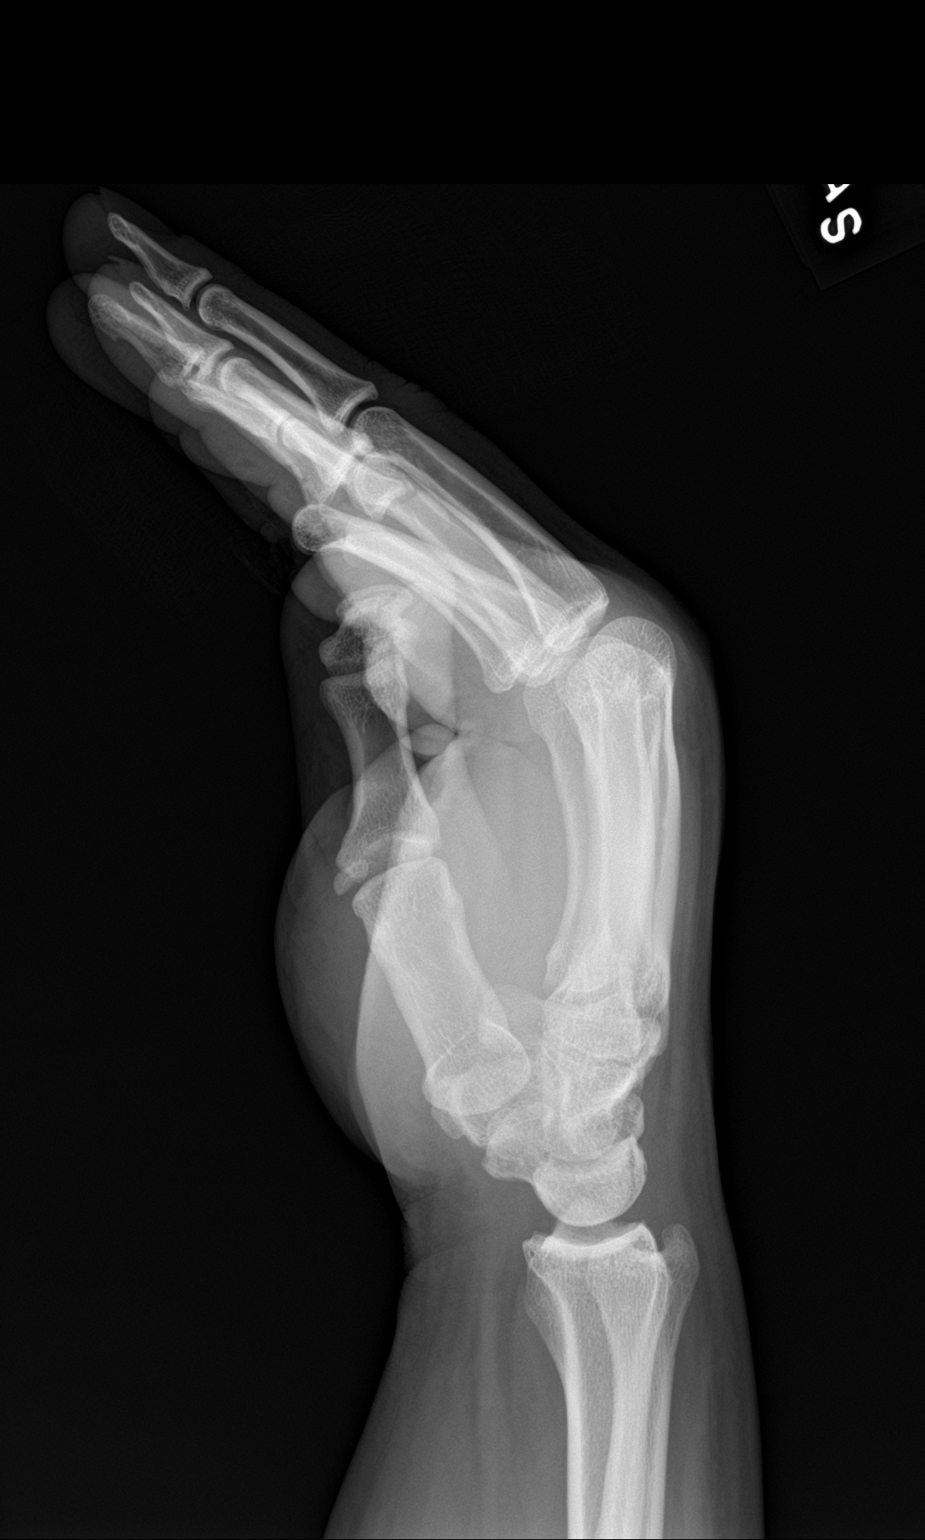

[3 of 3 positions shown; findings below may reference images not displayed]

FINDINGS: There is radial subluxation of the fourth digit at the proximal
interphalangeal joint, not well assessed on the lateral view due to
osseous overlap. Dorsal dislocation of the fifth digit at the
proximal interphalangeal joint. Third digit laceration at the
proximal interphalangeal joint with skin defect. No radiopaque
foreign body. No fractures of the digits or hand.
IMPRESSION: 1. Dorsal dislocation of the fifth digit at the proximal
interphalangeal joint. Radial subluxation of the fourth digit at the
proximal interphalangeal joint, not well assessed on the lateral
view due to osseous overlap.
2. Third digit laceration without radiopaque foreign body. The
fourth digit laceration is not well seen radiographically.

## 2019-10-10 ENCOUNTER — Other Ambulatory Visit: Payer: Self-pay

## 2019-10-10 ENCOUNTER — Encounter: Payer: Self-pay | Admitting: Nurse Practitioner

## 2019-10-10 ENCOUNTER — Ambulatory Visit (INDEPENDENT_AMBULATORY_CARE_PROVIDER_SITE_OTHER): Payer: 59 | Admitting: Nurse Practitioner

## 2019-10-10 VITALS — BP 132/91 | HR 97 | Temp 98.5°F | Resp 16 | Ht 66.0 in | Wt 246.0 lb

## 2019-10-10 DIAGNOSIS — Z0000 Encounter for general adult medical examination without abnormal findings: Secondary | ICD-10-CM | POA: Diagnosis not present

## 2019-10-10 DIAGNOSIS — E785 Hyperlipidemia, unspecified: Secondary | ICD-10-CM | POA: Diagnosis not present

## 2019-10-10 DIAGNOSIS — I10 Essential (primary) hypertension: Secondary | ICD-10-CM

## 2019-10-10 DIAGNOSIS — R29818 Other symptoms and signs involving the nervous system: Secondary | ICD-10-CM | POA: Diagnosis not present

## 2019-10-10 MED ORDER — AMLODIPINE BESYLATE 10 MG PO TABS: 10.0000 mg | ORAL_TABLET | Freq: Every day | ORAL | 3 refills | Status: DC

## 2019-10-10 NOTE — Progress Notes (Signed)
[facility] Patient [facility] [address] [place], [place]  [address] Phone:  [phone]   Fax:  [phone]   Established Patient Office Visit  Subjective:  Patient ID: Daniel Kelley, male    DOB: 03-28-1985  Age: 34 y.o. MRN: [id number]  CC:  Chief Complaint  Patient presents with  . Follow-up    1 month blood pressure follow up     HPI Daniel Kelley presents for follow up. He  has a past medical history of Hypertension, Mixed dyslipidemia (10/15/2019), and Tonsillitis.   Hypertension Patient is here for follow-up of elevated blood pressure. He is not exercising and is adherent to a low-salt diet. Blood pressure is well controlled at home [id number]). Cardiac symptoms: fatigue and paroxysmal nocturnal dyspnea. Patient denies chest pain, irregular heart beat, near-syncope, palpitations and syncope. Cardiovascular risk factors: dyslipidemia, hypertension, male gender, obesity (BMI >= 30 kg/m2) and sedentary lifestyle. Use of agents associated with hypertension: none. History of target organ damage: none. Daniel Kelley admits that he is feeling better with his current regimen.  He is no longer taking the Cardizem.  He admits that he needs does continue to have fatigue approximately 2 hours into his shift.  He admits that he has been told repeatedly that he snores at night discussed sleep apnea at his last walk-in visit.  Past Medical History:  Diagnosis Date  . Hypertension   . Mixed dyslipidemia 10/15/2019  . Tonsillitis     Past Surgical History:  Procedure Laterality Date  . FRACTURE SURGERY  12/2017   right hand   . I & D EXTREMITY Right 12/08/2017   Procedure: IRRIGATION AND DEBRIDEMENT EXTREMITY, RIGHT HAND;  Surgeon: Dominica Severin, MD;  Location: [facility] OR;  Service: Orthopedics;  Laterality: Right;  . NO PAST SURGERIES    . TONSILLECTOMY N/A 10/01/2015   Procedure: TONSILLECTOMY;  Surgeon: Drema Halon, MD;  Location: [facility];  Service: ENT;   Laterality: N/A;    History reviewed. No pertinent family history.  Social History   Socioeconomic History  . Marital status: Single    Spouse name: Not on file  . Number of children: Not on file  . Years of education: Not on file  . Highest education level: Not on file  Occupational History  . Not on file  Tobacco Use  . Smoking status: Never Smoker  . Smokeless tobacco: Former Clinical biochemist  . Vaping Use: Never used  Substance and Sexual Activity  . Alcohol use: Yes    Comment: 1 beer per might  . Drug use: No  . Sexual activity: Not on file  Other Topics Concern  . Not on file  Social History Narrative  . Not on file   Social Determinants of Health   Financial Resource Strain:   . Difficulty of Paying Living Expenses:   Food Insecurity:   . Worried About Programme researcher, broadcasting/film/video in the Last Year:   . Barista in the Last Year:   Transportation Needs:   . Freight forwarder (Medical):   Marland Kitchen Lack of Transportation (Non-Medical):   Physical Activity:   . Days of Exercise per Week:   . Minutes of Exercise per Session:   Stress:   . Feeling of Stress :   Social Connections:   . Frequency of Communication with Friends and Family:   . Frequency of Social Gatherings with Friends and Family:   . Attends Religious Services:   .  Active Member of Clubs or Organizations:   . Attends Banker Meetings:   Marland Kitchen Marital Status:   Intimate Partner Violence:   . Fear of Current or Ex-Partner:   . Emotionally Abused:   Marland Kitchen Physically Abused:   . Sexually Abused:     Outpatient Medications Prior to Visit  Medication Sig Dispense Refill  . losartan-hydrochlorothiazide (HYZAAR) 50-12.5 MG tablet Take 1 tablet by mouth 2 (two) times daily. 180 tablet 3  . amLODipine (NORVASC) 10 MG tablet Take 1 tablet (10 mg total) by mouth daily. 30 tablet 0  . diltiazem (CARDIZEM CD) 120 MG 24 hr capsule TAKE 1 CAPSULE BY MOUTH EVERY DAY (Patient not taking: Reported on  10/10/2019) 90 capsule 1   No facility-administered medications prior to visit.    Allergies  Allergen Reactions  . Shrimp [Shellfish Allergy] Hives, Itching, Swelling and Rash    Not anaphalaxis  . Penicillins     ROS Review of Systems    Objective:    Physical Exam Vitals reviewed.  Constitutional:      General: He is not in acute distress.    Appearance: He is obese. He is not ill-appearing, toxic-appearing or diaphoretic.  HENT:     Head: Normocephalic and atraumatic.     Nose: Nose normal.     Mouth/Throat:     Mouth: Mucous membranes are moist.  Cardiovascular:     Rate and Rhythm: Normal rate and regular rhythm.     Pulses: Normal pulses.     Heart sounds: Normal heart sounds.  Pulmonary:     Effort: Pulmonary effort is normal.     Breath sounds: Normal breath sounds.  Abdominal:     Palpations: Abdomen is soft.     Comments: Obese  Musculoskeletal:        General: Normal range of motion.     Cervical back: Normal range of motion.  Skin:    General: Skin is warm and dry.     Capillary Refill: Capillary refill takes less than 2 seconds.  Neurological:     General: No focal deficit present.     Mental Status: He is alert and oriented to person, place, and time.  Psychiatric:        Mood and Affect: Mood normal.        Behavior: Behavior normal.        Thought Content: Thought content normal.        Judgment: Judgment normal.     BP (!) 132/91 (BP Location: Left Arm, Patient Position: Sitting, Cuff Size: Large)   Pulse 97   Temp 98.5 F (36.9 C) (Oral)   Resp 16   Ht 5\' 6"  (1.676 m)   Wt 246 lb (111.6 kg)   SpO2 99%   BMI 39.71 kg/m  Wt Readings from Last 3 Encounters:  10/10/19 246 lb (111.6 kg)  11/14/18 230 lb (104.3 kg)  07/31/18 230 lb (104.3 kg)     Health Maintenance Due  Topic Date Due  . INFLUENZA VACCINE  10/05/2019    There are no preventive care reminders to display for this patient.  No results found for: TSH Lab Results    Component Value Date   WBC 6.2 12/08/2017   HGB 13.3 12/08/2017   HCT 40.0 12/08/2017   MCV 93.0 12/08/2017   PLT 244 12/08/2017   Lab Results  Component Value Date   NA 135 09/17/2019   K 4.1 09/17/2019   CO2 22 12/08/2017  GLUCOSE 107 (H) 09/17/2019   BUN 12 09/17/2019   CREATININE 1.00 09/17/2019   BILITOT 0.4 09/17/2019   ALKPHOS 59 09/17/2019   AST 41 (H) 09/17/2019   ALT 57 (H) 11/23/2017   PROT 7.4 09/17/2019   ALBUMIN 4.6 09/17/2019   CALCIUM 9.8 09/17/2019   ANIONGAP 14 12/08/2017   Lab Results  Component Value Date   CHOL 248 (H) 10/10/2019   Lab Results  Component Value Date   HDL 46 10/10/2019   Lab Results  Component Value Date   LDLCALC 168 (H) 10/10/2019   Lab Results  Component Value Date   TRIG 183 (H) 10/10/2019   Lab Results  Component Value Date   CHOLHDL 5.4 (H) 10/10/2019   Lab Results  Component Value Date   HGBA1C 5.5 09/17/2019   HGBA1C 5.5 09/17/2019   HGBA1C 5.5 (A) 09/17/2019   HGBA1C 5.5 09/17/2019      Assessment & Plan:   Problem List Items Addressed This Visit      Cardiovascular and Mediastinum   Essential hypertension - Primary Encouraged on going compliance with current medication regimen Encouraged home monitoring and recording BP <130/80 Eating a heart-healthy diet with less salt Encouraged regular physical activity  Recommend Weight loss      Relevant Medications   amLODipine (NORVASC) 10 MG tablet     Other   Suspected sleep apnea Long discussion with patient and sleep apnea and the risks of stroke and coronary artery disease.  Encourage patient to just have the evaluation.  Patient agrees   Relevant Orders   Ambulatory referral to Sleep Studies   Hyperlipidemia LDL goal <100   Relevant Medications   amLODipine (NORVASC) 10 MG tablet   Other Relevant Orders   Lipid panel (Completed)    Other Visit Diagnoses    Healthcare maintenance       Relevant Orders   Hepatitis C antibody (Completed)       Meds ordered this encounter  Medications  . amLODipine (NORVASC) 10 MG tablet    Sig: Take 1 tablet (10 mg total) by mouth daily.    Dispense:  90 tablet    Refill:  3    Order Specific Question:   Supervising Provider    Answer:   Charma Igo J [[id number]]    Follow-up: Return in about 3 months (around 01/10/2020).    Barbette Merino, NP

## 2019-10-11 LAB — LIPID PANEL
Chol/HDL Ratio: 5.4 ratio — ABNORMAL HIGH (ref 0.0–5.0)
Cholesterol, Total: 248 mg/dL — ABNORMAL HIGH (ref 100–199)
HDL: 46 mg/dL (ref >39)
LDL Chol Calc (NIH): 168 mg/dL — ABNORMAL HIGH (ref 0–99)
Triglycerides: 183 mg/dL — ABNORMAL HIGH (ref 0–149)
VLDL Cholesterol Cal: 34 mg/dL (ref 5–40)

## 2019-10-11 LAB — HEPATITIS C ANTIBODY: Hep C Virus Ab: <0.1 s/co ratio (ref 0.0–0.9)

## 2019-10-15 ENCOUNTER — Encounter: Payer: Self-pay | Admitting: Nurse Practitioner

## 2019-10-15 DIAGNOSIS — E782 Mixed hyperlipidemia: Secondary | ICD-10-CM

## 2019-10-15 HISTORY — DX: Mixed hyperlipidemia: E78.2

## 2019-10-16 ENCOUNTER — Telehealth: Payer: Self-pay

## 2019-10-16 ENCOUNTER — Other Ambulatory Visit: Payer: Self-pay

## 2019-10-16 MED ORDER — ROSUVASTATIN CALCIUM 10 MG PO TABS: 10.0000 mg | ORAL_TABLET | Freq: Every day | ORAL | 3 refills | Status: DC

## 2019-10-16 NOTE — Telephone Encounter (Signed)
Pt made aware and rx sent to pharmacy. Thanks!

## 2019-10-16 NOTE — Telephone Encounter (Signed)
-----   Message from Barbette Merino, NP sent at 10/15/2019  9:44 PM EDT ----- Please make him aware that his cholesterol is elevated. With his HTN this increases his risk of CAD and stroke.  With his current job situation Chartered loss adjuster) and not being able to exercise regularly.  I do recommend that he start a low-dose statin.  The final choice will be his.  My recommendation is to start Crestor.  Crestor 10 mg #90 nightly with 3 refills.  Patient needs a 32-month follow-up appointment.  Continue to monitor blood pressures.

## 2019-11-05 IMAGING — DX DG CHEST 2V
2 series · 2 of 2 positions shown · non-contrast
Comparison: None.

CLINICAL DATA: Patient with cough.

EXAM:
CHEST - 2 VIEW

[chest pa]
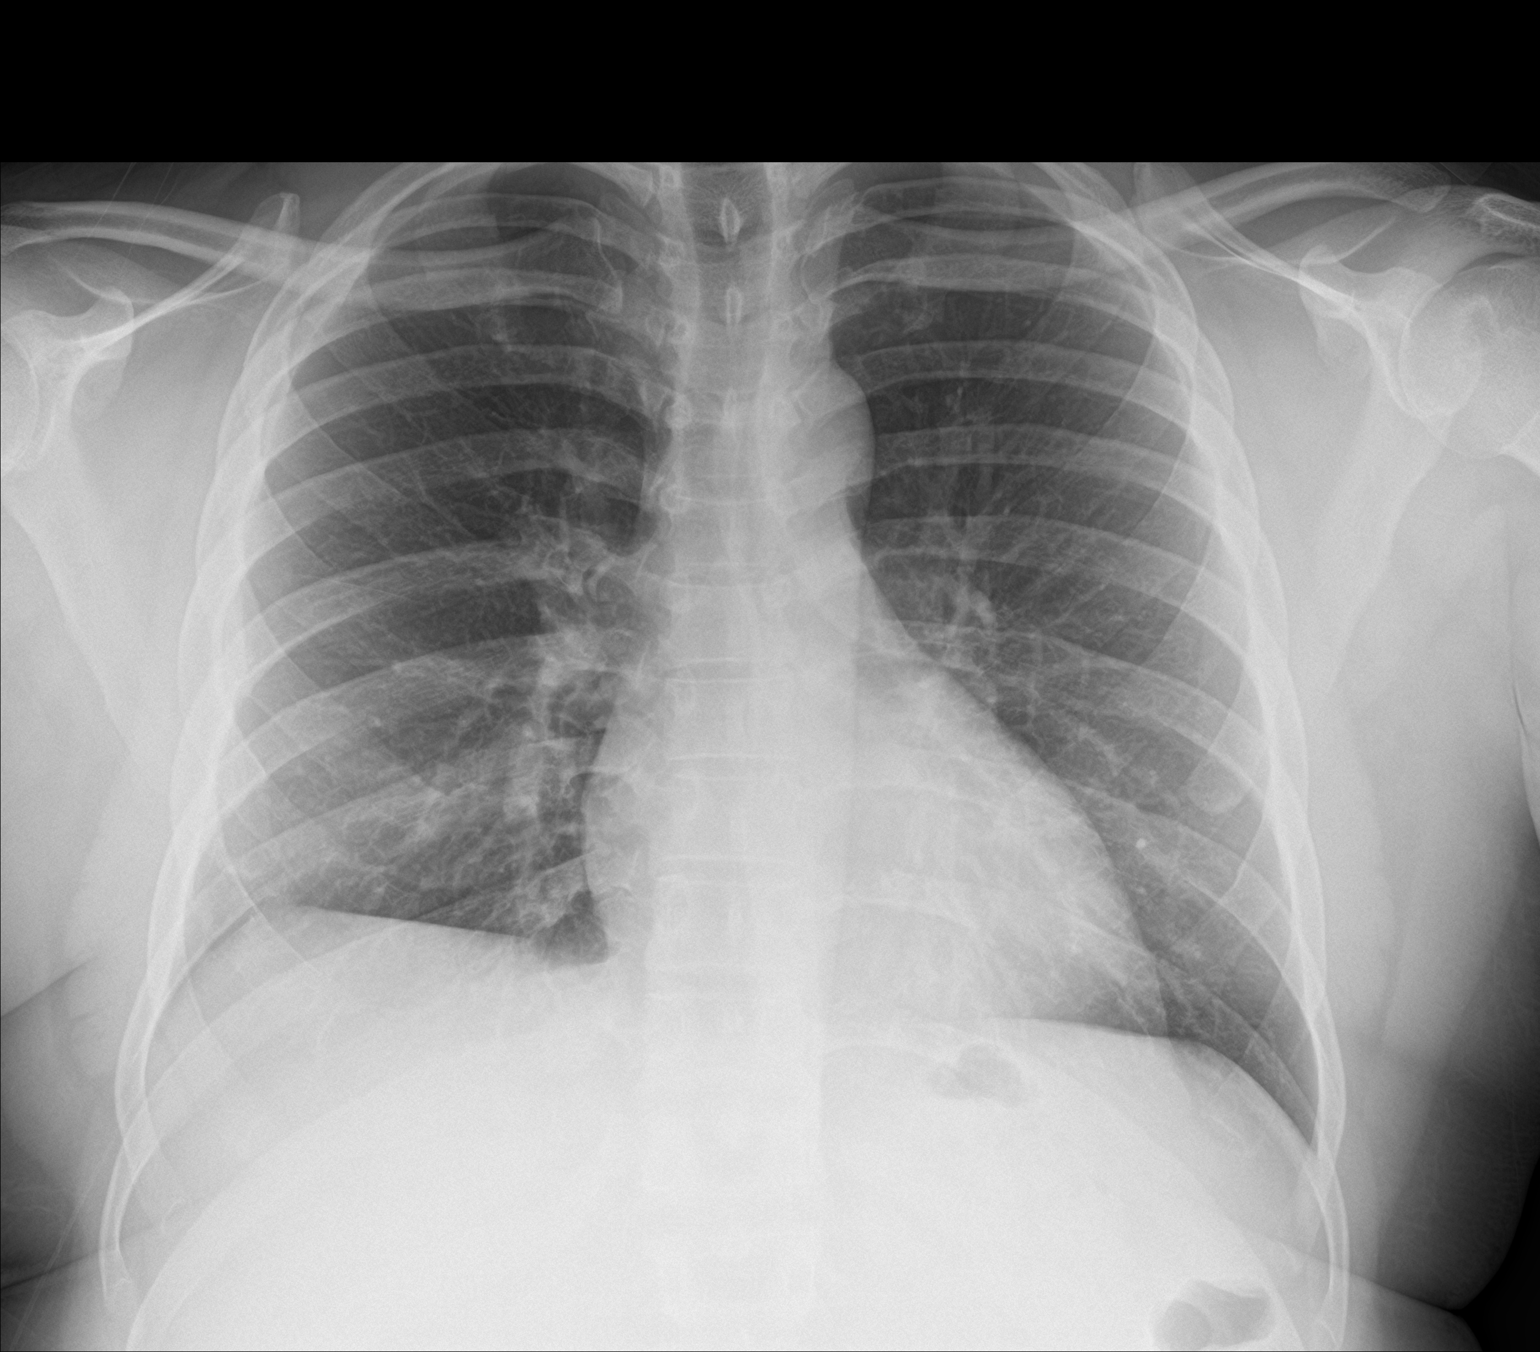

[chest lat]
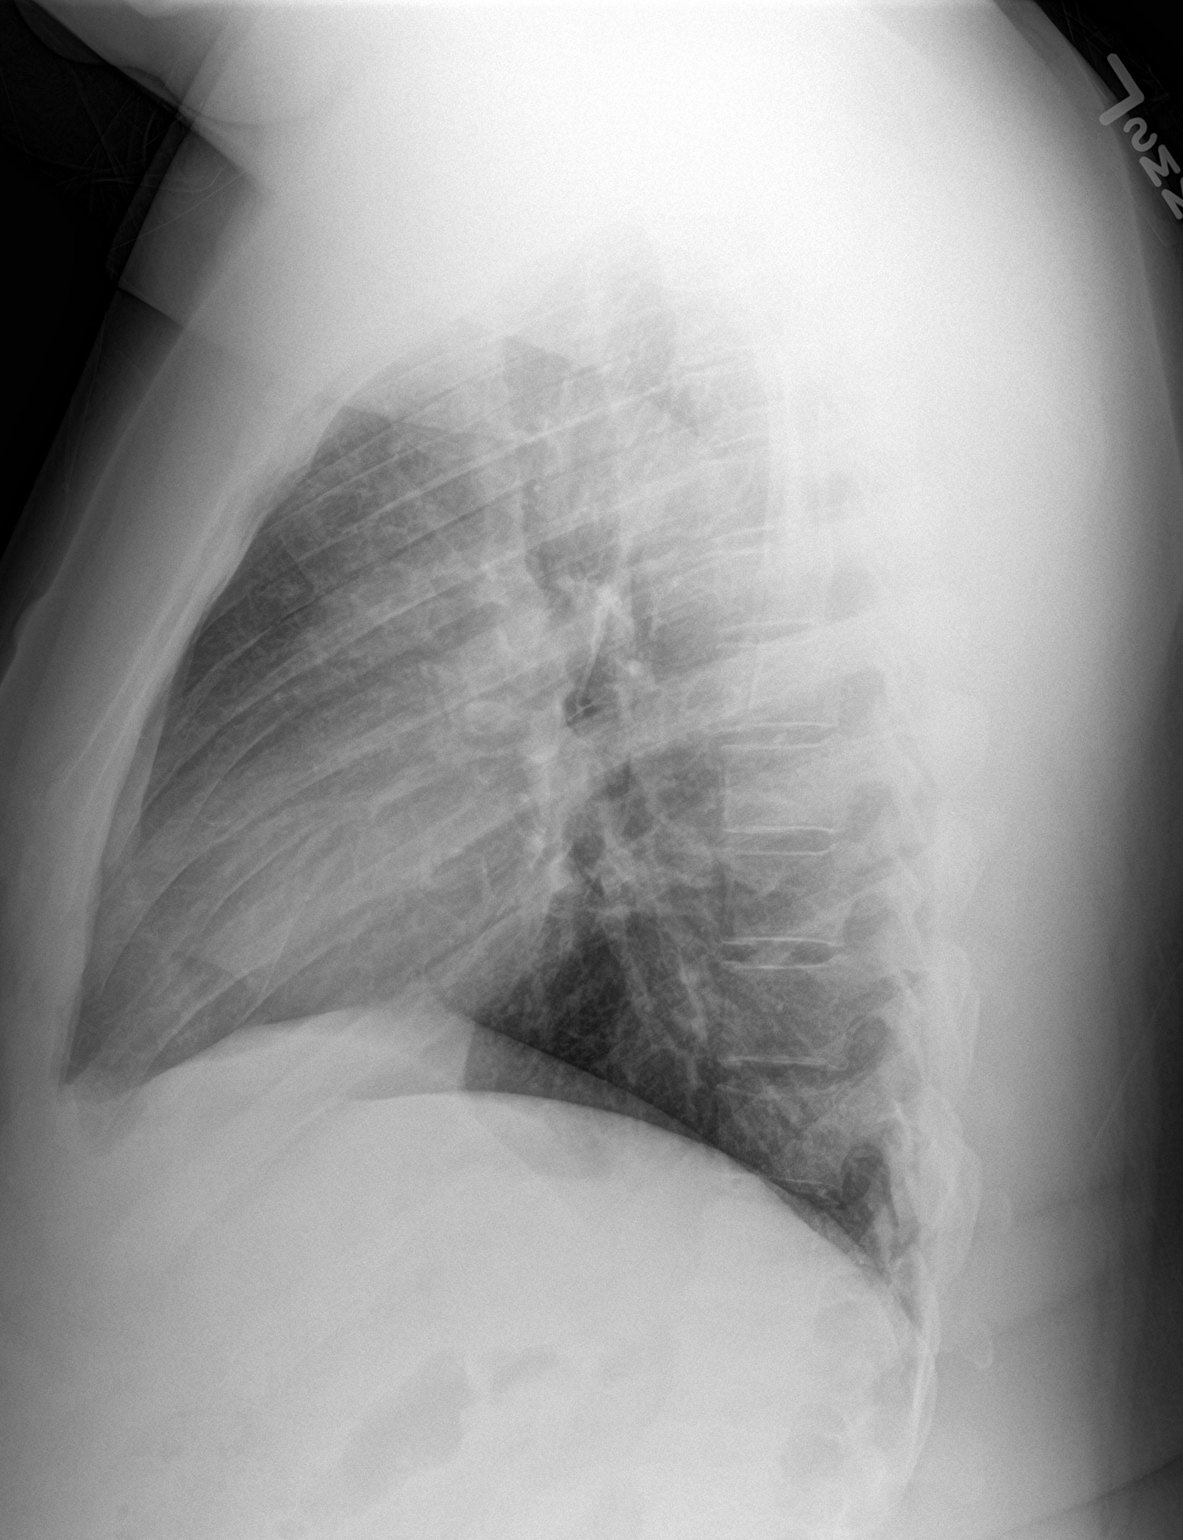

[2 of 2 positions shown; findings below may reference images not displayed]

FINDINGS: Normal cardiac and mediastinal contours. No consolidative pulmonary
opacities. No pleural effusion or pneumothorax. Regional skeleton is
unremarkable.
IMPRESSION: No acute cardiopulmonary process.

## 2020-01-12 ENCOUNTER — Encounter: Payer: Self-pay | Admitting: Nurse Practitioner

## 2020-01-12 ENCOUNTER — Other Ambulatory Visit: Payer: Self-pay

## 2020-01-12 ENCOUNTER — Ambulatory Visit (INDEPENDENT_AMBULATORY_CARE_PROVIDER_SITE_OTHER): Payer: Self-pay | Admitting: Nurse Practitioner

## 2020-01-12 VITALS — BP 132/81 | Temp 98.8°F | Resp 20 | Ht 66.0 in | Wt 246.0 lb

## 2020-01-12 DIAGNOSIS — F1020 Alcohol dependence, uncomplicated: Secondary | ICD-10-CM

## 2020-01-12 DIAGNOSIS — I10 Essential (primary) hypertension: Secondary | ICD-10-CM

## 2020-01-12 DIAGNOSIS — E785 Hyperlipidemia, unspecified: Secondary | ICD-10-CM

## 2020-01-12 MED ORDER — LOSARTAN POTASSIUM-HCTZ 50-12.5 MG PO TABS: 1.0000 | ORAL_TABLET | Freq: Two times a day (BID) | ORAL | 3 refills | Status: DC

## 2020-01-12 NOTE — Patient Instructions (Signed)
Alcohol Use Disorder °Alcohol use disorder is when your drinking disrupts your daily life. When you have this condition, you drink too much alcohol and you cannot control your drinking. °Alcohol use disorder can cause serious problems with your physical health. It can affect your brain, heart, liver, pancreas, immune system, stomach, and intestines. Alcohol use disorder can increase your risk for certain cancers and cause problems with your mental health, such as depression, anxiety, psychosis, delirium, and dementia. People with this disorder risk hurting themselves and others. °What are the causes? °This condition is caused by drinking too much alcohol over time. It is not caused by drinking too much alcohol only one or two times. Some people with this condition drink alcohol to cope with or escape from negative life events. Others drink to relieve pain or symptoms of mental illness. °What increases the risk? °You are more likely to develop this condition if: °· You have a family history of alcohol use disorder. °· Your culture encourages drinking to the point of intoxication, or makes alcohol easy to get. °· You had a mood or conduct disorder in childhood. °· You have been a victim of abuse. °· You are an adolescent and: °? You have poor grades or difficulties in school. °? Your caregivers do not talk to you about saying no to alcohol, or supervise your activities. °? You are impulsive or you have trouble with self-control. °What are the signs or symptoms? °Symptoms of this condition include: °· Drinking more than you want to. °· Drinking for longer than you want to. °· Trying several times to drink less or to control your drinking. °· Spending a lot of time getting alcohol, drinking, or recovering from drinking. °· Craving alcohol. °· Having problems at work, at school, or at home due to drinking. °· Having problems in relationships due to drinking. °· Drinking when it is dangerous to drink, such as before  driving a car. °· Continuing to drink even though you know you might have a physical or mental problem related to drinking. °· Needing more and more alcohol to get the same effect you want from the alcohol (building up tolerance). °· Having symptoms of withdrawal when you stop drinking. Symptoms of withdrawal include: °? Fatigue. °? Nightmares. °? Trouble sleeping. °? Depression. °? Anxiety. °? Fever. °? Seizures. °? Severe confusion. °? Feeling or seeing things that are not there (hallucinations). °? Tremors. °? Rapid heart rate. °? Rapid breathing. °? High blood pressure. °· Drinking to avoid symptoms of withdrawal. °How is this diagnosed? °This condition is diagnosed with an assessment. Your health care provider may start the assessment by asking three or four questions about your drinking. °Your health care provider may perform a physical exam or do lab tests to see if you have physical problems resulting from alcohol use. She or he may refer you to a mental health professional for evaluation. °How is this treated? °Some people with alcohol use disorder are able to reduce their alcohol use to low-risk levels. Others need to completely quit drinking alcohol. When necessary, mental health professionals with specialized training in substance use treatment can help. Your health care provider can help you decide how severe your alcohol use disorder is and what type of treatment you need. The following forms of treatment are available: °· Detoxification. Detoxification involves quitting drinking and using prescription medicines within the first week to help lessen withdrawal symptoms. This treatment is important for people who have had withdrawal symptoms before and for heavy drinkers   who are likely to have withdrawal symptoms. Alcohol withdrawal can be dangerous, and in severe cases, it can cause death. Detoxification may be provided in a home, community, or primary care setting, or in a hospital or substance use  treatment facility. °· Counseling. This treatment is also called talk therapy. It is provided by substance use treatment counselors. A counselor can address the reasons you use alcohol and suggest ways to keep you from drinking again or to prevent problem drinking. The goals of talk therapy are to: °? Find healthy activities and ways for you to cope with stress. °? Identify and avoid the things that trigger your alcohol use. °? Help you learn how to handle cravings. °· Medicines. Medicines can help treat alcohol use disorder by: °? Decreasing alcohol cravings. °? Decreasing the positive feeling you have when you drink alcohol. °? Causing an uncomfortable physical reaction when you drink alcohol (aversion therapy). °· Support groups. Support groups are led by people who have quit drinking. They provide emotional support, advice, and guidance. °These forms of treatment are often combined. Some people with this condition benefit from a combination of treatments provided by specialized substance use treatment centers. °Follow these instructions at home: °· Take over-the-counter and prescription medicines only as told by your health care provider. °· Check with your health care provider before starting any new medicines. °· Ask friends and family members not to offer you alcohol. °· Avoid situations where alcohol is served, including gatherings where others are drinking alcohol. °· Create a plan for what to do when you are tempted to use alcohol. °· Find hobbies or activities that you enjoy that do not include alcohol. °· Keep all follow-up visits as told by your health care provider. This is important. °How is this prevented? °· If you drink, limit alcohol intake to no more than 1 drink a day for nonpregnant women and 2 drinks a day for men. One drink equals 12 oz of beer, 5 oz of wine, or 1½ oz of hard liquor. °· If you have a mental health condition, get treatment and support. °· Do not give alcohol to  adolescents. °· If you are an adolescent: °? Do not drink alcohol. °? Do not be afraid to say no if someone offers you alcohol. Speak up about why you do not want to drink. You can be a positive role model for your friends and set a good example for those around you by not drinking alcohol. °? If your friends drink, spend time with others who do not drink alcohol. Make new friends who do not use alcohol. °? Find healthy ways to manage stress and emotions, such as meditation or deep breathing, exercise, spending time in nature, listening to music, or talking with a trusted friend or family member. °Contact a health care provider if: °· You are not able to take your medicines as told. °· Your symptoms get worse. °· You return to drinking alcohol (relapse) and your symptoms get worse. °Get help right away if: °· You have thoughts about hurting yourself or others. °If you ever feel like you may hurt yourself or others, or have thoughts about taking your own life, get help right away. You can go to your nearest emergency department or call: °· Your local emergency services (911 in the [place] °· A suicide crisis helpline, such as the National Suicide Prevention Lifeline at 1-800-273-8255. This is open 24 hours a day. °Summary °· Alcohol use disorder is when your drinking disrupts your daily   life. When you have this condition, you drink too much alcohol and you cannot control your drinking. °· Treatment may include detoxification, counseling, medicine, and support groups. °· Ask friends and family members not to offer you alcohol. Avoid situations where alcohol is served. °· Get help right away if you have thoughts about hurting yourself or others. °This information is not intended to replace advice given to you by your health care provider. Make sure you discuss any questions you have with your health care provider. °Document Revised: 02/02/2017 Document Reviewed: 11/18/2015 °[employer] Patient Education © 2020 [employer] ° °

## 2020-01-12 NOTE — Progress Notes (Signed)
[facility] Patient [facility] [address] [place], [place]  [address] Phone:  [phone]   Fax:  [phone]   Established Patient Office Visit  Subjective:  Patient ID: Daniel Kelley, male    DOB: 11-03-1985  Age: 34 y.o. MRN: [id number]  CC:  Chief Complaint  Patient presents with  . Follow-up    HPI Daniel Kelley presents for follow up. He  has a past medical history of Hypertension, Mixed dyslipidemia (10/15/2019), and Tonsillitis.   Hypertension Patient is here for follow-up of elevated blood pressure. He is not exercising and is adherent to a low-salt diet. Blood pressure is well controlled at home. Cardiac symptoms: none. Patient denies chest pain, dyspnea, exertional chest pressure/discomfort, fatigue, irregular heart beat, lower extremity edema, palpitations and syncope. Cardiovascular risk factors: dyslipidemia, hypertension, male gender, obesity (BMI >= 30 kg/m2) and sedentary lifestyle. Use of agents associated with hypertension: none. History of target organ damage: none.  Past Medical History:  Diagnosis Date  . Hypertension   . Mixed dyslipidemia 10/15/2019  . Tonsillitis     Past Surgical History:  Procedure Laterality Date  . FRACTURE SURGERY  12/2017   right hand   . I & D EXTREMITY Right 12/08/2017   Procedure: IRRIGATION AND DEBRIDEMENT EXTREMITY, RIGHT HAND;  Surgeon: Dominica Severin, MD;  Location: [facility];  Service: Orthopedics;  Laterality: Right;  . NO PAST SURGERIES    . TONSILLECTOMY N/A 10/01/2015   Procedure: TONSILLECTOMY;  Surgeon: Drema Halon, MD;  Location: [facility];  Service: ENT;  Laterality: N/A;    History reviewed. No pertinent family history.  Social History   Socioeconomic History  . Marital status: Single    Spouse name: Not on file  . Number of children: Not on file  . Years of education: Not on file  . Highest education level: Not on file  Occupational History  . Not on file  Tobacco Use  . Smoking  status: Never Smoker  . Smokeless tobacco: Former Clinical biochemist  . Vaping Use: Never used  Substance and Sexual Activity  . Alcohol use: Yes    Alcohol/week: 4.0 standard drinks    Types: 3 Shots of liquor, 1 Cans of beer per week    Comment: daily use  . Drug use: No  . Sexual activity: Not on file  Other Topics Concern  . Not on file  Social History Narrative  . Not on file   Social Determinants of Health   Financial Resource Strain:   . Difficulty of Paying Living Expenses: Not on file  Food Insecurity:   . Worried About Programme researcher, broadcasting/film/video in the Last Year: Not on file  . Ran Out of Food in the Last Year: Not on file  Transportation Needs:   . Lack of Transportation (Medical): Not on file  . Lack of Transportation (Non-Medical): Not on file  Physical Activity:   . Days of Exercise per Week: Not on file  . Minutes of Exercise per Session: Not on file  Stress:   . Feeling of Stress : Not on file  Social Connections:   . Frequency of Communication with Friends and Family: Not on file  . Frequency of Social Gatherings with Friends and Family: Not on file  . Attends Religious Services: Not on file  . Active Member of Clubs or Organizations: Not on file  . Attends Banker Meetings: Not on file  . Marital Status: Not on file  Intimate Partner Violence:   . Fear of Current or Ex-Partner: Not on file  . Emotionally Abused: Not on file  . Physically Abused: Not on file  . Sexually Abused: Not on file    Outpatient Medications Prior to Visit  Medication Sig Dispense Refill  . amLODipine (NORVASC) 10 MG tablet Take 1 tablet (10 mg total) by mouth daily. 90 tablet 3  . rosuvastatin (CRESTOR) 10 MG tablet Take 1 tablet (10 mg total) by mouth daily. 90 tablet 3  . losartan-hydrochlorothiazide (HYZAAR) 50-12.5 MG tablet Take 1 tablet by mouth 2 (two) times daily. 180 tablet 3   No facility-administered medications prior to visit.    Allergies  Allergen  Reactions  . Shrimp [Shellfish Allergy] Hives, Itching, Swelling and Rash    Not anaphalaxis  . Penicillins     ROS Review of Systems    Objective:    Physical Exam Constitutional:      Appearance: He is obese.  HENT:     Head: Normocephalic and atraumatic.     Nose: Nose normal.     Mouth/Throat:     Mouth: Mucous membranes are moist.  Cardiovascular:     Rate and Rhythm: Normal rate and regular rhythm.     Pulses: Normal pulses.     Heart sounds: Normal heart sounds.  Pulmonary:     Effort: Pulmonary effort is normal.     Breath sounds: Normal breath sounds.  Abdominal:     Palpations: Abdomen is soft.     Comments: Increased abdominal girth  Musculoskeletal:        General: Normal range of motion.     Cervical back: Normal range of motion.     Right lower leg: No edema.     Left lower leg: No edema.  Skin:    General: Skin is warm and dry.     Capillary Refill: Capillary refill takes less than 2 seconds.  Neurological:     General: No focal deficit present.     Mental Status: He is alert and oriented to person, place, and time.  Psychiatric:        Mood and Affect: Mood normal.        Behavior: Behavior normal.        Thought Content: Thought content normal.        Judgment: Judgment normal.     BP 132/81 (BP Location: Left Arm, Patient Position: Sitting)   Temp 98.8 F (37.1 C)   Resp 20   Ht 5\' 6"  (1.676 m)   Wt 246 lb (111.6 kg)   SpO2 100%   BMI 39.71 kg/m  Wt Readings from Last 3 Encounters:  01/12/20 246 lb (111.6 kg)  10/10/19 246 lb (111.6 kg)  11/14/18 230 lb (104.3 kg)     There are no preventive care reminders to display for this patient.  There are no preventive care reminders to display for this patient.  No results found for: TSH Lab Results  Component Value Date   WBC 6.2 12/08/2017   HGB 13.3 12/08/2017   HCT 40.0 12/08/2017   MCV 93.0 12/08/2017   PLT 244 12/08/2017   Lab Results  Component Value Date   NA 135  09/17/2019   K 4.1 09/17/2019   CO2 22 12/08/2017   GLUCOSE 107 (H) 09/17/2019   BUN 12 09/17/2019   CREATININE 1.00 09/17/2019   BILITOT 0.4 09/17/2019   ALKPHOS 59 09/17/2019   AST 41 (H) 09/17/2019   ALT 57 (  H) 11/23/2017   PROT 7.4 09/17/2019   ALBUMIN 4.6 09/17/2019   CALCIUM 9.8 09/17/2019   ANIONGAP 14 12/08/2017   Lab Results  Component Value Date   CHOL 248 (H) 10/10/2019   Lab Results  Component Value Date   HDL 46 10/10/2019   Lab Results  Component Value Date   LDLCALC 168 (H) 10/10/2019   Lab Results  Component Value Date   TRIG 183 (H) 10/10/2019   Lab Results  Component Value Date   CHOLHDL 5.4 (H) 10/10/2019   Lab Results  Component Value Date   HGBA1C 5.5 09/17/2019   HGBA1C 5.5 09/17/2019   HGBA1C 5.5 (A) 09/17/2019   HGBA1C 5.5 09/17/2019      Assessment & Plan:   Problem List Items Addressed This Visit      Cardiovascular and Mediastinum   Essential hypertension - Primary Encouraged on going compliance with current medication regimen Encouraged home monitoring and recording BP <130/80 Eating a heart-healthy diet with less salt.  Long discussion on possible supplemental snacks Encouraged regular physical activity  Recommend Weight loss   Relevant Medications   losartan-hydrochlorothiazide (HYZAAR) 50-12.5 MG tablet     Other   Hyperlipidemia LDL goal <100   Relevant Medications   losartan-hydrochlorothiazide (HYZAAR) 50-12.5 MG tablet    Other Visit Diagnoses    Alcohol use disorder, moderate, dependence (HCC)     Long discussion about alcohol use.  Patient has considered AA however due to time requirements this is not a current option.  Patient has been in classes with his father in the past. Patient education provided.  No treatment at this time because patient is just getting compliant with his blood pressure medications and feeling good about himself.  He is also reluctant to take additional medications.  Will consider  counseling via telehealth as an option      Meds ordered this encounter  Medications  . losartan-hydrochlorothiazide (HYZAAR) 50-12.5 MG tablet    Sig: Take 1 tablet by mouth 2 (two) times daily.    Dispense:  180 tablet    Refill:  3    Order Specific Question:   Supervising Provider    Answer:   Quentin Angst [id number]    Follow-up: Return in about 3 months (around 04/13/2020).    Barbette Merino, NP

## 2020-04-12 ENCOUNTER — Ambulatory Visit: Payer: Self-pay | Admitting: Nurse Practitioner

## 2020-04-16 ENCOUNTER — Other Ambulatory Visit: Payer: Self-pay

## 2020-04-16 ENCOUNTER — Ambulatory Visit (INDEPENDENT_AMBULATORY_CARE_PROVIDER_SITE_OTHER): Payer: 59 | Admitting: Nurse Practitioner

## 2020-04-16 ENCOUNTER — Encounter: Payer: Self-pay | Admitting: Nurse Practitioner

## 2020-04-16 VITALS — BP 141/82 | HR 100 | Temp 97.2°F | Ht 66.0 in | Wt 253.0 lb

## 2020-04-16 DIAGNOSIS — H9202 Otalgia, left ear: Secondary | ICD-10-CM

## 2020-04-16 DIAGNOSIS — I10 Essential (primary) hypertension: Secondary | ICD-10-CM

## 2020-04-16 MED ORDER — CIPROFLOXACIN-DEXAMETHASONE 0.3-0.1 % OT SUSP: 4.0000 [drp] | Freq: Two times a day (BID) | OTIC | 0 refills | Status: AC

## 2020-04-16 NOTE — Progress Notes (Signed)
[facility] Patient [facility] [address] [place], [place]  [address] Phone:  [phone]   Fax:  [phone]   Established Patient Office Visit  Subjective:  Patient ID: Daniel Kelley, male    DOB: 10/08/1985  Age: 35 y.o. MRN: [id number]  CC:  Chief Complaint  Patient presents with  . Follow-up    Ear pain left possible infection , no drainage , no  fever , no sore throat , started on Monday or Sunday     HPI Daniel Kelley presents for follow up. He  has a past medical history of Hypertension, Mixed dyslipidemia (10/15/2019), and Tonsillitis.   Ear Pain Patient complains of left ear pain. Symptoms have been present 5 days. He also notes no hearing loss and moderate pain in the left ear. He does not have a history of ear infections. He does not have a history of recent swimming. He has tried no medications  for his symptoms.   His hypertension is stable. He is taking his medication as directed and is tolerating well. 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 He is aware of the need to reduce sodium intake. He has gained about 7 pounds in 3 months. He is not able to exercise.   Past Medical History:  Diagnosis Date  . Hypertension   . Mixed dyslipidemia 10/15/2019  . Tonsillitis     Past Surgical History:  Procedure Laterality Date  . FRACTURE SURGERY  12/2017   right hand   . I & D EXTREMITY Right 12/08/2017   Procedure: IRRIGATION AND DEBRIDEMENT EXTREMITY, RIGHT HAND;  Surgeon: Dominica Severin, MD;  Location: [facility] OR;  Service: Orthopedics;  Laterality: Right;  . NO PAST SURGERIES    . TONSILLECTOMY N/A 10/01/2015   Procedure: TONSILLECTOMY;  Surgeon: Drema Halon, MD;  Location: [facility];  Service: ENT;  Laterality: N/A;    History reviewed. No pertinent family history.  Social History   Socioeconomic History  . Marital status: Single    Spouse name: Not on file  . Number of children: Not on file   . Years of education: Not on file  . Highest education level: Not on file  Occupational History  . Not on file  Tobacco Use  . Smoking status: Never Smoker  . Smokeless tobacco: Former Clinical biochemist  . Vaping Use: Never used  Substance and Sexual Activity  . Alcohol use: Yes    Alcohol/week: 4.0 standard drinks    Types: 3 Shots of liquor, 1 Cans of beer per week    Comment: daily use  . Drug use: No  . Sexual activity: Not on file  Other Topics Concern  . Not on file  Social History Narrative  . Not on file   Social Determinants of Health   Financial Resource Strain: Not on file  Food Insecurity: Not on file  Transportation Needs: Not on file  Physical Activity: Not on file  Stress: Not on file  Social Connections: Not on file  Intimate Partner Violence: Not on file    Outpatient Medications Prior to Visit  Medication Sig Dispense Refill  . amLODipine (NORVASC) 10 MG tablet Take 1 tablet (10 mg total) by mouth daily. 90 tablet 3  . losartan-hydrochlorothiazide (HYZAAR) 50-12.5 MG tablet Take 1 tablet by mouth 2 (two) times daily. 180 tablet 3  . rosuvastatin (CRESTOR) 10 MG tablet Take 1 tablet (10 mg total) by  mouth daily. 90 tablet 3   No facility-administered medications prior to visit.    Allergies  Allergen Reactions  . Shrimp [Shellfish Allergy] Hives, Itching, Swelling and Rash    Not anaphalaxis  . Penicillins     ROS Review of Systems  Constitutional: Negative.   HENT: Positive for ear pain. Negative for ear discharge, sinus pressure, sinus pain and sneezing.   Eyes: Negative.   Respiratory: Negative for cough and shortness of breath.   Cardiovascular: Negative for chest pain.  Endocrine: Negative.   Neurological: Negative for dizziness, light-headedness and headaches.      Objective:    Physical Exam Constitutional:      General: He is not in acute distress.    Appearance: He is obese. He is not ill-appearing, toxic-appearing or  diaphoretic.  HENT:     Head: Normocephalic and atraumatic.     Right Ear: Tympanic membrane normal. There is no impacted cerumen.     Left Ear: Tympanic membrane normal. There is no impacted cerumen.     Nose: Nose normal.     Mouth/Throat:     Mouth: Mucous membranes are moist.  Cardiovascular:     Rate and Rhythm: Normal rate and regular rhythm.     Pulses: Normal pulses.     Heart sounds: Normal heart sounds.  Pulmonary:     Effort: Pulmonary effort is normal.     Breath sounds: Normal breath sounds.  Musculoskeletal:        General: Normal range of motion.     Cervical back: Normal range of motion.  Skin:    General: Skin is warm and dry.     Capillary Refill: Capillary refill takes less than 2 seconds.  Neurological:     General: No focal deficit present.     Mental Status: He is alert and oriented to person, place, and time.  Psychiatric:        Mood and Affect: Mood normal.        Behavior: Behavior normal.        Thought Content: Thought content normal.        Judgment: Judgment normal.     BP (!) 141/82 (BP Location: Left Arm, Patient Position: Sitting, Cuff Size: Normal)   Pulse 100   Temp (!) 97.2 F (36.2 C) (Temporal)   Ht 5\' 6"  (1.676 m)   Wt 253 lb (114.8 kg)   SpO2 98%   BMI 40.84 kg/m  Wt Readings from Last 3 Encounters:  04/16/20 253 lb (114.8 kg)  01/12/20 246 lb (111.6 kg)  10/10/19 246 lb (111.6 kg)     There are no preventive care reminders to display for this patient.  There are no preventive care reminders to display for this patient.  No results found for: TSH Lab Results  Component Value Date   WBC 6.2 12/08/2017   HGB 13.3 12/08/2017   HCT 40.0 12/08/2017   MCV 93.0 12/08/2017   PLT 244 12/08/2017   Lab Results  Component Value Date   NA 135 09/17/2019   K 4.1 09/17/2019   CO2 22 12/08/2017   GLUCOSE 107 (H) 09/17/2019   BUN 12 09/17/2019   CREATININE 1.00 09/17/2019   BILITOT 0.4 09/17/2019   ALKPHOS 59 09/17/2019    AST 41 (H) 09/17/2019   ALT 57 (H) 11/23/2017   PROT 7.4 09/17/2019   ALBUMIN 4.6 09/17/2019   CALCIUM 9.8 09/17/2019   ANIONGAP 14 12/08/2017   Lab Results  Component Value Date  CHOL 248 (H) 10/10/2019   Lab Results  Component Value Date   HDL 46 10/10/2019   Lab Results  Component Value Date   LDLCALC 168 (H) 10/10/2019   Lab Results  Component Value Date   TRIG 183 (H) 10/10/2019   Lab Results  Component Value Date   CHOLHDL 5.4 (H) 10/10/2019   Lab Results  Component Value Date   HGBA1C 5.5 09/17/2019   HGBA1C 5.5 09/17/2019   HGBA1C 5.5 (A) 09/17/2019   HGBA1C 5.5 09/17/2019      Assessment & Plan:   Problem List Items Addressed This Visit      Cardiovascular and Mediastinum   Essential hypertension - Primary Stable Encouraged on going compliance with current medication regimen Encouraged home monitoring and recording BP <130/80 Eating a heart-healthy diet with less salt Encouraged regular physical activity  Recommend Weight loss   Relevant Orders   Comp. Metabolic Panel (12)    Other Visit Diagnoses    Left ear pain     Persistent  Will trial Ciprodex as directed Did encourage OTC ear pain if needed      Meds ordered this encounter  Medications  . ciprofloxacin-dexamethasone (CIPRODEX) OTIC suspension    Sig: Place 4 drops into the left ear 2 (two) times daily for 8 days.    Dispense:  7.5 mL    Refill:  0    Order Specific Question:   Supervising Provider    Answer:   Quentin Angst [id number]    Follow-up: Return in about 3 months (around 07/14/2020) for Follow up HTN [id number].    Barbette Merino, NP

## 2020-04-16 NOTE — Patient Instructions (Signed)

## 2020-04-17 LAB — COMP. METABOLIC PANEL (12)
AST: 30 IU/L (ref 0–40)
Albumin/Globulin Ratio: 1.8 (ref 1.2–2.2)
Albumin: 5 g/dL (ref 4.0–5.0)
Alkaline Phosphatase: 65 IU/L (ref 44–121)
BUN/Creatinine Ratio: 11 (ref 9–20)
BUN: 12 mg/dL (ref 6–20)
Bilirubin Total: 0.3 mg/dL (ref 0.0–1.2)
Calcium: 9.7 mg/dL (ref 8.7–10.2)
Chloride: 101 mmol/L (ref 96–106)
Creatinine, Ser: 1.1 mg/dL (ref 0.76–1.27)
GFR calc Af Amer: 100 mL/min/{1.73_m2} (ref >59)
GFR calc non Af Amer: 87 mL/min/{1.73_m2} (ref >59)
Globulin, Total: 2.8 g/dL (ref 1.5–4.5)
Glucose: 89 mg/dL (ref 65–99)
Potassium: 3.9 mmol/L (ref 3.5–5.2)
Sodium: 141 mmol/L (ref 134–144)
Total Protein: 7.8 g/dL (ref 6.0–8.5)

## 2020-07-16 ENCOUNTER — Other Ambulatory Visit: Payer: Self-pay

## 2020-07-16 ENCOUNTER — Encounter: Payer: Self-pay | Admitting: Nurse Practitioner

## 2020-07-16 ENCOUNTER — Ambulatory Visit (INDEPENDENT_AMBULATORY_CARE_PROVIDER_SITE_OTHER): Payer: 59 | Admitting: Nurse Practitioner

## 2020-07-16 VITALS — BP 130/78 | HR 95 | Temp 98.1°F | Ht 66.0 in | Wt 254.1 lb

## 2020-07-16 DIAGNOSIS — E785 Hyperlipidemia, unspecified: Secondary | ICD-10-CM

## 2020-07-16 DIAGNOSIS — I10 Essential (primary) hypertension: Secondary | ICD-10-CM | POA: Diagnosis not present

## 2020-07-16 DIAGNOSIS — L568 Other specified acute skin changes due to ultraviolet radiation: Secondary | ICD-10-CM

## 2020-07-16 MED ORDER — HYDROCHLOROTHIAZIDE 25 MG PO TABS: 25.0000 mg | ORAL_TABLET | Freq: Every day | ORAL | 3 refills | Status: DC

## 2020-07-16 MED ORDER — LOSARTAN POTASSIUM 50 MG PO TABS: 50.0000 mg | ORAL_TABLET | Freq: Every day | ORAL | 3 refills | Status: DC

## 2020-07-16 NOTE — Patient Instructions (Signed)
Managing Your Hypertension Hypertension, also called high blood pressure, is when the force of the blood pressing against the walls of the arteries is too strong. Arteries are blood vessels that carry blood from your heart throughout your body. Hypertension forces the heart to work harder to pump blood and may cause the arteries to become narrow or stiff. Understanding blood pressure readings Your personal target blood pressure may vary depending on your medical conditions, your age, and other factors. A blood pressure reading includes a higher number over a lower number. Ideally, your blood pressure should be below 120/80. You should know that:  The first, or top, number is called the systolic pressure. It is a measure of the pressure in your arteries as your heart beats.  The second, or bottom number, is called the diastolic pressure. It is a measure of the pressure in your arteries as the heart relaxes. Blood pressure is classified into four stages. Based on your blood pressure reading, your health care provider may use the following stages to determine what type of treatment you need, if any. Systolic pressure and diastolic pressure are measured in a unit called mmHg. Normal  Systolic pressure: below 120.  Diastolic pressure: below 80. Elevated  Systolic pressure: 120-129.  Diastolic pressure: below 80. Hypertension stage 1  Systolic pressure: 130-139.  Diastolic pressure: 80-89. Hypertension stage 2  Systolic pressure: 140 or above.  Diastolic pressure: 90 or above. How can this condition affect me? Managing your hypertension is an important responsibility. Over time, hypertension can damage the arteries and decrease blood flow to important parts of the body, including the brain, heart, and kidneys. Having untreated or uncontrolled hypertension can lead to:  A heart attack.  A stroke.  A weakened blood vessel (aneurysm).  Heart failure.  Kidney damage.  Eye  damage.  Metabolic syndrome.  Memory and concentration problems.  Vascular dementia. What actions can I take to manage this condition? Hypertension can be managed by making lifestyle changes and possibly by taking medicines. Your health care provider will help you make a plan to bring your blood pressure within a normal range. Nutrition  Eat a diet that is high in fiber and potassium, and low in salt (sodium), added sugar, and fat. An example eating plan is called the Dietary Approaches to Stop Hypertension (DASH) diet. To eat this way: ? Eat plenty of fresh fruits and vegetables. Try to fill one-half of your plate at each meal with fruits and vegetables. ? Eat whole grains, such as whole-wheat pasta, brown rice, or whole-grain bread. Fill about one-fourth of your plate with whole grains. ? Eat low-fat dairy products. ? Avoid fatty cuts of meat, processed or cured meats, and poultry with skin. Fill about one-fourth of your plate with lean proteins such as fish, chicken without skin, beans, eggs, and tofu. ? Avoid pre-made and processed foods. These tend to be higher in sodium, added sugar, and fat.  Reduce your daily sodium intake. Most people with hypertension should eat less than 1,500 mg of sodium a day.   Lifestyle  Work with your health care provider to maintain a healthy body weight or to lose weight. Ask what an ideal weight is for you.  Get at least 30 minutes of exercise that causes your heart to beat faster (aerobic exercise) most days of the week. Activities may include walking, swimming, or biking.  Include exercise to strengthen your muscles (resistance exercise), such as weight lifting, as part of your weekly exercise routine. Try   to do these types of exercises for 30 minutes at least 3 days a week.  Do not use any products that contain nicotine or tobacco, such as cigarettes, e-cigarettes, and chewing tobacco. If you need help quitting, ask your health care  provider.  Control any long-term (chronic) conditions you have, such as high cholesterol or diabetes.  Identify your sources of stress and find ways to manage stress. This may include meditation, deep breathing, or making time for fun activities.   Alcohol use  Do not drink alcohol if: ? Your health care provider tells you not to drink. ? You are pregnant, may be pregnant, or are planning to become pregnant.  If you drink alcohol: ? Limit how much you use to:  0-1 drink a day for women.  0-2 drinks a day for men. ? Be aware of how much alcohol is in your drink. In the [place] one drink equals one 12 oz bottle of beer (355 mL), one 5 oz glass of wine (148 mL), or one 1 oz glass of hard liquor (44 mL). Medicines Your health care provider may prescribe medicine if lifestyle changes are not enough to get your blood pressure under control and if:  Your systolic blood pressure is 130 or higher.  Your diastolic blood pressure is 80 or higher. Take medicines only as told by your health care provider. Follow the directions carefully. Blood pressure medicines must be taken as told by your health care provider. The medicine does not work as well when you skip doses. Skipping doses also puts you at risk for problems. Monitoring Before you monitor your blood pressure:  Do not smoke, drink caffeinated beverages, or exercise within 30 minutes before taking a measurement.  Use the bathroom and empty your bladder (urinate).  Sit quietly for at least 5 minutes before taking measurements. Monitor your blood pressure at home as told by your health care provider. To do this:  Sit with your back straight and supported.  Place your feet flat on the floor. Do not cross your legs.  Support your arm on a flat surface, such as a table. Make sure your upper arm is at heart level.  Each time you measure, take two or three readings one minute apart and record the results. You may also need to have your  blood pressure checked regularly by your health care provider.   General information  Talk with your health care provider about your diet, exercise habits, and other lifestyle factors that may be contributing to hypertension.  Review all the medicines you take with your health care provider because there may be side effects or interactions.  Keep all visits as told by your health care provider. Your health care provider can help you create and adjust your plan for managing your high blood pressure. Where to find more information  National Heart, Lung, and Blood Institute: www.nhlbi.nih.gov  American Heart Association: www.heart.org Contact a health care provider if:  You think you are having a reaction to medicines you have taken.  You have repeated (recurrent) headaches.  You feel dizzy.  You have swelling in your ankles.  You have trouble with your vision. Get help right away if:  You develop a severe headache or confusion.  You have unusual weakness or numbness, or you feel faint.  You have severe pain in your chest or abdomen.  You vomit repeatedly.  You have trouble breathing. These symptoms may represent a serious problem that is an emergency. Do not wait   to see if the symptoms will go away. Get medical help right away. Call your local emergency services (911 in the [place] Do not drive yourself to the hospital. Summary  Hypertension is when the force of blood pumping through your arteries is too strong. If this condition is not controlled, it may put you at risk for serious complications.  Your personal target blood pressure may vary depending on your medical conditions, your age, and other factors. For most people, a normal blood pressure is less than 120/80.  Hypertension is managed by lifestyle changes, medicines, or both.  Lifestyle changes to help manage hypertension include losing weight, eating a healthy, low-sodium diet, exercising more, stopping smoking, and  limiting alcohol. This information is not intended to replace advice given to you by your health care provider. Make sure you discuss any questions you have with your health care provider. Document Revised: 03/28/2019 Document Reviewed: 01/21/2019 Elsevier Patient Education  2021 [employer]  

## 2020-07-16 NOTE — Progress Notes (Signed)
[facility] [address] [place], [place]  [address] Phone:  [phone]   Fax:  [phone]   Established Patient Office Visit  Subjective:  Patient ID: Daniel Kelley, male    DOB: 1985/08/19  Age: 35 y.o. MRN: [id number]  CC:  Chief Complaint  Patient presents with  . Follow-up    3 month follow up, fluid retention swelling in hands     HPI Daniel Kelley presents for follow up. He  has a past medical history of Hypertension, Mixed dyslipidemia (10/15/2019), and Tonsillitis.   He is following up to day for HTN and hyperlipidemia. He is on amlodipine 10 mg and losartan/HCTZ 50/12.5 mg along with rosuvastatin 10 mg. He felt like he has had some fluid retention. He is does not like feel like he is voiding enough.   He has asked to have a tint wavier. He admits that at some times during the day he has to lay down to regroup and to rest his eyes due to the increased sunlight passing through the truck.    Past Medical History:  Diagnosis Date  . Hypertension   . Mixed dyslipidemia 10/15/2019  . Tonsillitis     Past Surgical History:  Procedure Laterality Date  . FRACTURE SURGERY  12/2017   right hand   . I & D EXTREMITY Right 12/08/2017   Procedure: IRRIGATION AND DEBRIDEMENT EXTREMITY, RIGHT HAND;  Surgeon: Dominica Severin, MD;  Location: [facility];  Service: Orthopedics;  Laterality: Right;  . NO PAST SURGERIES    . TONSILLECTOMY N/A 10/01/2015   Procedure: TONSILLECTOMY;  Surgeon: Drema Halon, MD;  Location: [facility];  Service: ENT;  Laterality: N/A;    History reviewed. No pertinent family history.  Social History   Socioeconomic History  . Marital status: Single    Spouse name: Not on file  . Number of children: Not on file  . Years of education: Not on file  . Highest education level: Not on file  Occupational History  . Not on file  Tobacco Use  . Smoking status: Never Smoker  . Smokeless tobacco: Former Geophysicist/field seismologist  . Vaping Use: Never used  Substance and Sexual Activity  . Alcohol use: Yes    Alcohol/week: 4.0 standard drinks    Types: 3 Shots of liquor, 1 Cans of beer per week    Comment: daily use  . Drug use: No  . Sexual activity: Not on file  Other Topics Concern  . Not on file  Social History Narrative  . Not on file   Social Determinants of Health   Financial Resource Strain: Not on file  Food Insecurity: Not on file  Transportation Needs: Not on file  Physical Activity: Not on file  Stress: Not on file  Social Connections: Not on file  Intimate Partner Violence: Not on file    Outpatient Medications Prior to Visit  Medication Sig Dispense Refill  . amLODipine (NORVASC) 10 MG tablet Take 1 tablet (10 mg total) by mouth daily. 90 tablet 3  . rosuvastatin (CRESTOR) 10 MG tablet Take 1 tablet (10 mg total) by mouth daily. 90 tablet 3  . losartan-hydrochlorothiazide (HYZAAR) 50-12.5 MG tablet Take 1 tablet by mouth 2 (two) times daily. 180 tablet 3   No facility-administered medications prior to visit.    Allergies  Allergen Reactions  . Shrimp [Shellfish Allergy] Hives, Itching, Swelling and Rash    Not anaphalaxis  . Penicillins   .  Shrimp Extract Allergy Skin Test     ROS Review of Systems    Objective:    Physical Exam Constitutional:      Appearance: He is obese.  HENT:     Head: Normocephalic and atraumatic.     Nose: Nose normal.     Mouth/Throat:     Mouth: Mucous membranes are moist.  Cardiovascular:     Rate and Rhythm: Normal rate and regular rhythm.     Pulses: Normal pulses.     Heart sounds: Normal heart sounds.  Pulmonary:     Effort: Pulmonary effort is normal.     Breath sounds: Normal breath sounds.  Musculoskeletal:        General: Normal range of motion.     Cervical back: Normal range of motion.  Skin:    General: Skin is warm.     Capillary Refill: Capillary refill takes less than 2 seconds.  Neurological:     General: No focal  deficit present.     Mental Status: He is alert and oriented to person, place, and time.  Psychiatric:        Mood and Affect: Mood normal.        Behavior: Behavior normal.        Thought Content: Thought content normal.        Judgment: Judgment normal.     BP 130/78 (BP Location: Left Arm, Patient Position: Sitting, Cuff Size: Large)   Pulse 95   Temp 98.1 F (36.7 C)   Ht 5\' 6"  (1.676 m)   Wt 254 lb 0.8 oz (115.2 kg)   SpO2 97%   BMI 41.00 kg/m  Wt Readings from Last 3 Encounters:  07/16/20 254 lb 0.8 oz (115.2 kg)  04/16/20 253 lb (114.8 kg)  01/12/20 246 lb (111.6 kg)     Health Maintenance Due  Topic Date Due  . COVID-19 Vaccine (1) Never done    There are no preventive care reminders to display for this patient.  No results found for: TSH Lab Results  Component Value Date   WBC 6.2 12/08/2017   HGB 13.3 12/08/2017   HCT 40.0 12/08/2017   MCV 93.0 12/08/2017   PLT 244 12/08/2017   Lab Results  Component Value Date   NA 141 04/16/2020   K 3.9 04/16/2020   CO2 22 12/08/2017   GLUCOSE 89 04/16/2020   BUN 12 04/16/2020   CREATININE 1.10 04/16/2020   BILITOT 0.3 04/16/2020   ALKPHOS 65 04/16/2020   AST 30 04/16/2020   ALT 57 (H) 11/23/2017   PROT 7.8 04/16/2020   ALBUMIN 5.0 04/16/2020   CALCIUM 9.7 04/16/2020   ANIONGAP 14 12/08/2017   Lab Results  Component Value Date   CHOL 248 (H) 10/10/2019   Lab Results  Component Value Date   HDL 46 10/10/2019   Lab Results  Component Value Date   LDLCALC 168 (H) 10/10/2019   Lab Results  Component Value Date   TRIG 183 (H) 10/10/2019   Lab Results  Component Value Date   CHOLHDL 5.4 (H) 10/10/2019   Lab Results  Component Value Date   HGBA1C 5.5 09/17/2019   HGBA1C 5.5 09/17/2019   HGBA1C 5.5 (A) 09/17/2019   HGBA1C 5.5 09/17/2019      Assessment & Plan:   Problem List Items Addressed This Visit      Cardiovascular and Mediastinum   Essential hypertension - Primary Encouraged on  going compliance with current medication regimen Encouraged home monitoring  and recording BP <130/80 Eating a heart-healthy diet with less salt Encouraged regular physical activity  Recommend Weight loss   Relevant Medications   losartan (COZAAR) 50 MG tablet   hydrochlorothiazide (HYDRODIURIL) 25 MG tablet   Other Relevant Orders   Comp. 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 losartan (COZAAR) 50 MG tablet    Sig: Take 1 tablet (50 mg total) by mouth daily.    Dispense:  90 tablet    Refill:  3    Order Specific Question:   Supervising Provider    Answer:   Quentin Angst [id number]  . hydrochlorothiazide (HYDRODIURIL) 25 MG tablet    Sig: Take 1 tablet (25 mg total) by mouth daily.    Dispense:  90 tablet    Refill:  3    Order Specific Question:   Supervising Provider    Answer:   Quentin Angst [id number]    Follow-up: Return in about 3 months (around 10/16/2020).    Barbette Merino, NP

## 2020-07-17 LAB — COMP. METABOLIC PANEL (12)
AST: 38 IU/L (ref 0–40)
Albumin/Globulin Ratio: 2 (ref 1.2–2.2)
Albumin: 5.2 g/dL — ABNORMAL HIGH (ref 4.0–5.0)
Alkaline Phosphatase: 58 IU/L (ref 44–121)
BUN/Creatinine Ratio: 13 (ref 9–20)
BUN: 12 mg/dL (ref 6–20)
Bilirubin Total: 0.4 mg/dL (ref 0.0–1.2)
Calcium: 9.8 mg/dL (ref 8.7–10.2)
Chloride: 99 mmol/L (ref 96–106)
Creatinine, Ser: 0.96 mg/dL (ref 0.76–1.27)
Globulin, Total: 2.6 g/dL (ref 1.5–4.5)
Glucose: 82 mg/dL (ref 65–99)
Potassium: 4 mmol/L (ref 3.5–5.2)
Sodium: 140 mmol/L (ref 134–144)
Total Protein: 7.8 g/dL (ref 6.0–8.5)
eGFR: 105 mL/min/{1.73_m2} (ref >59)

## 2020-07-17 LAB — LIPID PANEL
Chol/HDL Ratio: 3.1 ratio (ref 0.0–5.0)
Cholesterol, Total: 161 mg/dL (ref 100–199)
HDL: 52 mg/dL (ref >39)
LDL Chol Calc (NIH): 73 mg/dL (ref 0–99)
Triglycerides: 223 mg/dL — ABNORMAL HIGH (ref 0–149)
VLDL Cholesterol Cal: 36 mg/dL (ref 5–40)

## 2020-08-30 ENCOUNTER — Other Ambulatory Visit: Payer: Self-pay

## 2020-08-30 ENCOUNTER — Encounter: Payer: Self-pay | Admitting: Nurse Practitioner

## 2020-08-30 ENCOUNTER — Ambulatory Visit (INDEPENDENT_AMBULATORY_CARE_PROVIDER_SITE_OTHER): Payer: 59 | Admitting: Nurse Practitioner

## 2020-08-30 VITALS — BP 127/82 | HR 99 | Temp 97.2°F | Ht 66.0 in | Wt 253.1 lb

## 2020-08-30 DIAGNOSIS — F1020 Alcohol dependence, uncomplicated: Secondary | ICD-10-CM | POA: Insufficient documentation

## 2020-08-30 DIAGNOSIS — I10 Essential (primary) hypertension: Secondary | ICD-10-CM | POA: Diagnosis not present

## 2020-08-30 DIAGNOSIS — R37 Sexual dysfunction, unspecified: Secondary | ICD-10-CM | POA: Diagnosis not present

## 2020-08-30 MED ORDER — LOSARTAN POTASSIUM 50 MG PO TABS: 50.0000 mg | ORAL_TABLET | Freq: Two times a day (BID) | ORAL | 3 refills | Status: DC

## 2020-08-30 MED ORDER — SILDENAFIL CITRATE 100 MG PO TABS: 50.0000 mg | ORAL_TABLET | Freq: Every day | ORAL | 0 refills | Status: DC | PRN

## 2020-08-30 NOTE — Progress Notes (Signed)
[name],   [address] Phone:  [phone]   Fax:  [phone]    Established Patient Office Visit  Subjective:  Patient ID: Daniel Kelley, male    DOB: [birth year]  Age: 35 y.o. MRN: [id number]  CC:  Chief Complaint  Patient presents with   Follow-up    Questions about BP medications, wanting to confirm dosage    HPI Daniel Kelley presents for follow up. He  has a past medical history of Hypertension, Mixed dyslipidemia (10/15/2019), and Tonsillitis.   He is in today for a follow up. He does have question about his losartan. Due to increased swelling we adjusted his Losartan to increased the HCTZ. However he noted that he has been taking Losartan twice a day. His BP is well controlled at home. Denies headache, dizziness, visual changes, shortness of breath, dyspnea on exertion, chest pain, nausea, vomiting or any edema. He does not some sexual performance issues with the amlodipine. He has and erection however not always able to maintain. He denies any change in libido.  Past Medical History:  Diagnosis Date   Hypertension    Mixed dyslipidemia 10/15/2019   Tonsillitis     Past Surgical History:  Procedure Laterality Date   FRACTURE SURGERY  12/2017   right hand    I & D EXTREMITY Right 12/08/2017   Procedure: IRRIGATION AND DEBRIDEMENT EXTREMITY, RIGHT HAND;  Surgeon: Roseanne Kaufman, MD;  Location: [facility];  Service: Orthopedics;  Laterality: Right;   NO PAST SURGERIES     TONSILLECTOMY N/A 10/01/2015   Procedure: TONSILLECTOMY;  Surgeon: Rozetta Nunnery, MD;  Location: [facility];  Service: ENT;  Laterality: N/A;    History reviewed. No pertinent family history.  Social History   Socioeconomic History   Marital status: Single    Spouse name: Not on file   Number of children: Not on file   Years of education: Not on file   Highest education level: Not on file  Occupational History   Not on file   Tobacco Use   Smoking status: Never   Smokeless tobacco: Former  Scientific laboratory technician Use: Never used  Substance and Sexual Activity   Alcohol use: Yes    Alcohol/week: 4.0 standard drinks    Types: 3 Shots of liquor, 1 Cans of beer per week    Comment: daily use   Drug use: No   Sexual activity: Not on file  Other Topics Concern   Not on file  Social History Narrative   Not on file   Social Determinants of Health   Financial Resource Strain: Not on file  Food Insecurity: Not on file  Transportation Needs: Not on file  Physical Activity: Not on file  Stress: Not on file  Social Connections: Not on file  Intimate Partner Violence: Not on file    Outpatient Medications Prior to Visit  Medication Sig Dispense Refill   amLODipine (NORVASC) 10 MG tablet Take 1 tablet (10 mg total) by mouth daily. 90 tablet 3   hydrochlorothiazide (HYDRODIURIL) 25 MG tablet Take 1 tablet (25 mg total) by mouth daily. 90 tablet 3   rosuvastatin (CRESTOR) 10 MG tablet Take 1 tablet (10 mg total) by mouth daily. 90 tablet 3   losartan (COZAAR) 50 MG tablet Take 1 tablet (50 mg total) by mouth daily. 90 tablet 3   No facility-administered medications prior to visit.    Allergies  Allergen Reactions  Shrimp [Shellfish Allergy] Hives, Itching, Swelling and Rash    Not anaphalaxis   Penicillins    Shrimp Extract Allergy Skin Test     ROS Review of Systems    Objective:    Physical Exam Constitutional:      General: He is not in acute distress.    Appearance: He is obese. He is not ill-appearing, toxic-appearing or diaphoretic.  HENT:     Nose: Nose normal.     Mouth/Throat:     Mouth: Mucous membranes are moist.  Cardiovascular:     Rate and Rhythm: Normal rate and regular rhythm.     Pulses: Normal pulses.     Heart sounds: Normal heart sounds.  Pulmonary:     Effort: Pulmonary effort is normal.     Breath sounds: Normal breath sounds.  Abdominal:     Palpations: Abdomen is  soft.  Musculoskeletal:     Cervical back: Normal range of motion.     Right lower leg: No edema.     Left lower leg: No edema.  Skin:    General: Skin is warm.     Capillary Refill: Capillary refill takes less than 2 seconds.  Neurological:     General: No focal deficit present.     Mental Status: He is alert and oriented to person, place, and time.  Psychiatric:        Mood and Affect: Mood normal.        Behavior: Behavior normal.        Thought Content: Thought content normal.        Judgment: Judgment normal.   BP 127/82 (BP Location: Left Arm, Patient Position: Sitting)   Pulse 99   Temp (!) 97.2 F (36.2 C)   Ht _0  (1.676 m)   Wt 253 lb 0.8 oz (114.8 kg)   SpO2 97%   BMI 40.84 kg/m  Wt Readings from Last 3 Encounters:  08/30/20 253 lb 0.8 oz (114.8 kg)  07/16/20 254 lb 0.8 oz (115.2 kg)  04/16/20 253 lb (114.8 kg)     There are no preventive care reminders to display for this patient.   There are no preventive care reminders to display for this patient.  No results found for: TSH Lab Results  Component Value Date   WBC 6.2 12/08/2017   HGB 13.3 12/08/2017   HCT 40.0 12/08/2017   MCV 93.0 12/08/2017   PLT 244 12/08/2017   Lab Results  Component Value Date   NA 140 07/16/2020   K 4.0 07/16/2020   CO2 22 12/08/2017   GLUCOSE 82 07/16/2020   BUN 12 07/16/2020   CREATININE 0.96 07/16/2020   BILITOT 0.4 07/16/2020   ALKPHOS 58 07/16/2020   AST 38 07/16/2020   ALT 57 (H) 11/23/2017   PROT 7.8 07/16/2020   ALBUMIN 5.2 (H) 07/16/2020   CALCIUM 9.8 07/16/2020   ANIONGAP 14 12/08/2017   EGFR 105 07/16/2020   Lab Results  Component Value Date   CHOL 161 07/16/2020   Lab Results  Component Value Date   HDL 52 07/16/2020   Lab Results  Component Value Date   LDLCALC 73 07/16/2020   Lab Results  Component Value Date   TRIG 223 (H) 07/16/2020   Lab Results  Component Value Date   CHOLHDL 3.1 07/16/2020   Lab Results  Component Value Date    HGBA1C 5.5 09/17/2019   HGBA1C 5.5 09/17/2019   HGBA1C 5.5 (A) 09/17/2019   HGBA1C 5.5 09/17/2019  Assessment & Plan:   Problem List Items Addressed This Visit       Cardiovascular and Mediastinum   Essential hypertension - Primary Encouraged on going compliance with current medication regimen Encouraged home monitoring and recording BP <130/80 Eating a heart-healthy diet with less salt Encouraged regular physical activity  Recommend Weight loss   Relevant Medications   losartan (COZAAR) 50 MG tablet   sildenafil (VIAGRA) 100 MG tablet   Other Visit Diagnoses     Sexual dysfunction       Relevant Orders   Testosterone       Meds ordered this encounter  Medications   losartan (COZAAR) 50 MG tablet    Sig: Take 1 tablet (50 mg total) by mouth in the morning and at bedtime.    Dispense:  180 tablet    Refill:  3    Order Specific Question:   Supervising Provider    Answer:   Tresa Garter [[id number]]   sildenafil (VIAGRA) 100 MG tablet    Sig: Take 0.5-1 tablets (50-100 mg total) by mouth daily as needed for erectile dysfunction.    Dispense:  10 tablet    Refill:  0    Order Specific Question:   Supervising Provider    Answer:   Tresa Garter [id number]     Follow-up: Return in about 5 months (around 01/30/2021) for [place].    Vevelyn Francois, NP

## 2020-08-30 NOTE — Patient Instructions (Signed)
Managing Your Hypertension Hypertension, also called high blood pressure, is when the force of the blood pressing against the walls of the arteries is too strong. Arteries are blood vessels that carry blood from your heart throughout your body. Hypertension forces the heart to work harder to pump blood and may cause the arteries tobecome narrow or stiff. Understanding blood pressure readings Your personal target blood pressure may vary depending on your medical conditions, your age, and other factors. A blood pressure reading includes a higher number over a lower number. Ideally, your blood pressure should be below 120/80. You should know that: The first, or top, number is called the systolic pressure. It is a measure of the pressure in your arteries as your heart beats. The second, or bottom number, is called the diastolic pressure. It is a measure of the pressure in your arteries as the heart relaxes. Blood pressure is classified into four stages. Based on your blood pressure reading, your health care provider may use the following stages to determine what type of treatment you need, if any. Systolic pressure and diastolicpressure are measured in a unit called mmHg. Normal Systolic pressure: below 120. Diastolic pressure: below 80. Elevated Systolic pressure: 120-129. Diastolic pressure: below 80. Hypertension stage 1 Systolic pressure: 130-139. Diastolic pressure: 80-89. Hypertension stage 2 Systolic pressure: 140 or above. Diastolic pressure: 90 or above. How can this condition affect me? Managing your hypertension is an important responsibility. Over time, hypertension can damage the arteries and decrease blood flow to important parts of the body, including the brain, heart, and kidneys. Having untreated or uncontrolled hypertension can lead to: A heart attack. A stroke. A weakened blood vessel (aneurysm). Heart failure. Kidney damage. Eye damage. Metabolic syndrome. Memory and  concentration problems. Vascular dementia. What actions can I take to manage this condition? Hypertension can be managed by making lifestyle changes and possibly by taking medicines. Your health care provider will help you make a plan to bring yourblood pressure within a normal range. Nutrition  Eat a diet that is high in fiber and potassium, and low in salt (sodium), added sugar, and fat. An example eating plan is called the Dietary Approaches to Stop Hypertension (DASH) diet. To eat this way: Eat plenty of fresh fruits and vegetables. Try to fill one-half of your plate at each meal with fruits and vegetables. Eat whole grains, such as whole-wheat pasta, brown rice, or whole-grain bread. Fill about one-fourth of your plate with whole grains. Eat low-fat dairy products. Avoid fatty cuts of meat, processed or cured meats, and poultry with skin. Fill about one-fourth of your plate with lean proteins such as fish, chicken without skin, beans, eggs, and tofu. Avoid pre-made and processed foods. These tend to be higher in sodium, added sugar, and fat. Reduce your daily sodium intake. Most people with hypertension should eat less than 1,500 mg of sodium a day.  Lifestyle  Work with your health care provider to maintain a healthy body weight or to lose weight. Ask what an ideal weight is for you. Get at least 30 minutes of exercise that causes your heart to beat faster (aerobic exercise) most days of the week. Activities may include walking, swimming, or biking. Include exercise to strengthen your muscles (resistance exercise), such as weight lifting, as part of your weekly exercise routine. Try to do these types of exercises for 30 minutes at least 3 days a week. Do not use any products that contain nicotine or tobacco, such as cigarettes, e-cigarettes, and chewing   tobacco. If you need help quitting, ask your health care provider. Control any long-term (chronic) conditions you have, such as high  cholesterol or diabetes. Identify your sources of stress and find ways to manage stress. This may include meditation, deep breathing, or making time for fun activities.  Alcohol use Do not drink alcohol if: Your health care provider tells you not to drink. You are pregnant, may be pregnant, or are planning to become pregnant. If you drink alcohol: Limit how much you use to: 0-1 drink a day for women. 0-2 drinks a day for men. Be aware of how much alcohol is in your drink. In the [place] one drink equals one 12 oz bottle of beer (355 mL), one 5 oz glass of wine (148 mL), or one 1 oz glass of hard liquor (44 mL). Medicines Your health care provider may prescribe medicine if lifestyle changes are not enough to get your blood pressure under control and if: Your systolic blood pressure is 130 or higher. Your diastolic blood pressure is 80 or higher. Take medicines only as told by your health care provider. Follow the directions carefully. Blood pressure medicines must be taken as told by your health care provider. The medicine does not work as well when you skip doses. Skippingdoses also puts you at risk for problems. Monitoring Before you monitor your blood pressure: Do not smoke, drink caffeinated beverages, or exercise within 30 minutes before taking a measurement. Use the bathroom and empty your bladder (urinate). Sit quietly for at least 5 minutes before taking measurements. Monitor your blood pressure at home as told by your health care provider. To do this: Sit with your back straight and supported. Place your feet flat on the floor. Do not cross your legs. Support your arm on a flat surface, such as a table. Make sure your upper arm is at heart level. Each time you measure, take two or three readings one minute apart and record the results. You may also need to have your blood pressure checked regularly by your healthcare provider. General information Talk with your health care  provider about your diet, exercise habits, and other lifestyle factors that may be contributing to hypertension. Review all the medicines you take with your health care provider because there may be side effects or interactions. Keep all visits as told by your health care provider. Your health care provider can help you create and adjust your plan for managing your high blood pressure. Where to find more information National Heart, Lung, and Blood Institute: www.nhlbi.nih.gov American Heart Association: www.heart.org Contact a health care provider if: You think you are having a reaction to medicines you have taken. You have repeated (recurrent) headaches. You feel dizzy. You have swelling in your ankles. You have trouble with your vision. Get help right away if: You develop a severe headache or confusion. You have unusual weakness or numbness, or you feel faint. You have severe pain in your chest or abdomen. You vomit repeatedly. You have trouble breathing. These symptoms may represent a serious problem that is an emergency. Do not wait to see if the symptoms will go away. Get medical help right away. Call your local emergency services (911 in the [place] Do not drive yourself to the hospital. Summary Hypertension is when the force of blood pumping through your arteries is too strong. If this condition is not controlled, it may put you at risk for serious complications. Your personal target blood pressure may vary depending on your medical conditions,   your age, and other factors. For most people, a normal blood pressure is less than 120/80. Hypertension is managed by lifestyle changes, medicines, or both. Lifestyle changes to help manage hypertension include losing weight, eating a healthy, low-sodium diet, exercising more, stopping smoking, and limiting alcohol. This information is not intended to replace advice given to you by your health care provider. Make sure you discuss any questions  you have with your healthcare provider. Document Revised: 03/28/2019 Document Reviewed: 01/21/2019 Elsevier Patient Education  2022 Elsevier Inc.   Erectile Dysfunction Erectile dysfunction (ED) is the inability to get or keep an erection in order to have sexual intercourse. ED is considered a symptom of an underlying disorder and not considered a disease. Erectile dysfunction may include: Inability to get an erection. Lack of enough hardness of the erection to allow penetration. Loss of the erection before sex is finished. What are the causes? This condition may be caused by: Certain medicines, such as: Pain relievers. Antihistamines. Antidepressants. Blood pressure medicines. Water pills (diuretics). Ulcer medicines. Muscle relaxants. Drugs. Excessive drinking. Psychological causes, such as: Anxiety. Depression. Sadness. Exhaustion. Performance fear. Stress. Physical causes, such as: Artery problems. This may include diabetes, smoking, liver disease, or atherosclerosis. High blood pressure. Hormonal problems, such as low testosterone. Obesity. Nerve problems. This may include back or pelvic injuries, diabetes mellitus, multiple sclerosis, or Parkinson's disease. What are the signs or symptoms? Symptoms of this condition include: Inability to get an erection. Lack of enough hardness of the erection to allow penetration. Loss of the erection before sex is finished. Normal erections at some times, but with frequent unsatisfactory episodes. Low sexual satisfaction in either partner due to erection problems. A curved penis occurring with erection. The curve may cause pain or the penis may be too curved to allow for intercourse. Never having nighttime erections. How is this diagnosed? This condition is often diagnosed by: Performing a physical exam to find other diseases or specific problems with the penis. Asking you detailed questions about the problem. Performing blood  tests to check for diabetes mellitus or to measure hormone levels. Performing other tests to check for underlying health conditions. Performing an ultrasound exam to check for scarring. Performing a test to check blood flow to the penis. Doing a sleep study at home to measure nighttime erections. How is this treated? This condition may be treated by: Medicine taken by mouth to help you achieve an erection (oral medicine). Hormone replacement therapy to replace low testosterone levels. Medicine that is injected into the penis. Your health care provider may instruct you how to give yourself these injections at home. Vacuum pump. This is a pump with a ring on it. The pump and ring are placed on the penis and used to create pressure that helps the penis become erect. Penile implant surgery. In this procedure, you may receive: An inflatable implant. This consists of cylinders, a pump, and a reservoir. The cylinders can be inflated with a fluid that helps to create an erection, and they can be deflated after intercourse. A semi-rigid implant. This consists of two silicone rubber rods. The rods provide some rigidity. They are also flexible, so the penis can both curve downward in its normal position and become straight for sexual intercourse. Blood vessel surgery, to improve blood flow to the penis. During this procedure, a blood vessel from a different part of the body is placed into the penis to allow blood to flow around (bypass) damaged or blocked blood vessels. Lifestyle changes, such  as exercising more, losing weight, and quitting smoking. Follow these instructions at home: Medicines  Take over-the-counter and prescription medicines only as told by your health care provider. Do not increase the dosage without first discussing it with your health care provider. If you are using self-injections, perform injections as directed by your health care provider. Make sure to avoid any veins that are on the  surface of the penis. After giving an injection, apply pressure to the injection site for 5 minutes.  General instructions Exercise regularly, as directed by your health care provider. Work with your health care provider to lose weight, if needed. Do not use any products that contain nicotine or tobacco, such as cigarettes and e-cigarettes. If you need help quitting, ask your health care provider. Before using a vacuum pump, read the instructions that come with the pump and discuss any questions with your health care provider. Keep all follow-up visits as told by your health care provider. This is important. Contact a health care provider if: You feel nauseous. You vomit. Get help right away if: You are taking oral or injectable medicines and you have an erection that lasts longer than 4 hours. If your health care provider is unavailable, go to the nearest emergency room for evaluation. An erection that lasts much longer than 4 hours can result in permanent damage to your penis. You have severe pain in your groin or abdomen. You develop redness or severe swelling of your penis. You have redness spreading up into your groin or lower abdomen. You are unable to urinate. You experience chest pain or a rapid heart beat (palpitations) after taking oral medicines. Summary Erectile dysfunction (ED) is the inability to get or keep an erection during sexual intercourse. This problem can usually be treated successfully. This condition is diagnosed based on a physical exam, your symptoms, and tests to determine the cause. Treatment varies depending on the cause and may include medicines, hormone therapy, surgery, or a vacuum pump. You may need follow-up visits to make sure that you are using your medicines or devices correctly. Get help right away if you are taking or injecting medicines and you have an erection that lasts longer than 4 hours. This information is not intended to replace advice given to you  by your health care provider. Make sure you discuss any questions you have with your healthcare provider. Document Revised: 02/10/2020 Document Reviewed: 11/07/2019 Elsevier Patient Education  2022 [employer].

## 2020-08-31 LAB — TESTOSTERONE: Testosterone: 156 ng/dL — ABNORMAL LOW (ref 264–916)

## 2020-09-10 ENCOUNTER — Other Ambulatory Visit: Payer: Self-pay | Admitting: Nurse Practitioner

## 2020-09-10 MED ORDER — TESTOSTERONE 50 MG/5GM (1%) TD GEL: 5.0000 g | Freq: Every day | TRANSDERMAL | 5 refills | Status: DC

## 2020-09-10 NOTE — Progress Notes (Signed)
   [facility] [address] , [place]  [address] Phone:  [phone]   Fax:  [phone] 

## 2020-09-13 ENCOUNTER — Other Ambulatory Visit: Payer: Self-pay | Admitting: Nurse Practitioner

## 2020-09-13 MED ORDER — TESTOSTERONE 20.25 MG/ACT (1.62%) TD GEL: 2.0000 | Freq: Every day | TRANSDERMAL | 2 refills | Status: DC

## 2020-09-17 ENCOUNTER — Telehealth: Payer: Self-pay

## 2020-09-17 NOTE — Telephone Encounter (Signed)
Prior Auth (EOC) ID: [id number]   Drug/Service Name: TESTOSTERONE 1.62% GEL PUMP  Date Requested: 09/17/2020 11:52:22 AM   MemberID: [id number] DOB: 1985-07-16

## 2020-09-17 NOTE — Telephone Encounter (Signed)
Drug/Service Name: TESTOSTERONE 1.62% GEL PUMP   EOC [id number] Status: Denied Date Requested:09/17/2020 11:52:22 Date Closed: 09/17/2020 13:01:18

## 2020-09-24 ENCOUNTER — Telehealth: Payer: Self-pay

## 2020-09-24 NOTE — Telephone Encounter (Signed)
Prior Daniel Kelley was requested.It has been stated awaiting response form insurance.

## 2020-09-24 NOTE — Telephone Encounter (Signed)
Testosterone refill  He's been sending my chart messages and no responses   Can you check that Marylu Lund?  [place] will you be able to refill this?

## 2020-09-26 ENCOUNTER — Other Ambulatory Visit: Payer: Self-pay | Admitting: Nurse Practitioner

## 2020-09-30 ENCOUNTER — Other Ambulatory Visit: Payer: Self-pay | Admitting: Nurse Practitioner

## 2020-09-30 MED ORDER — TESTOSTERONE CYPIONATE 200 MG/ML IM SOLN: 200.0000 mg | INTRAMUSCULAR | 0 refills | Status: DC

## 2020-10-27 ENCOUNTER — Other Ambulatory Visit: Payer: Self-pay | Admitting: Nurse Practitioner

## 2021-01-31 ENCOUNTER — Ambulatory Visit: Payer: 59 | Admitting: Nurse Practitioner

## 2021-05-26 ENCOUNTER — Emergency Department (HOSPITAL_BASED_OUTPATIENT_CLINIC_OR_DEPARTMENT_OTHER)
Admission: EM | Admit: 2021-05-26 | Discharge: 2021-05-26 | Disposition: A | Discharge status: Home / Self Care | Payer: BC Managed Care – PPO | Attending: Emergency Medicine | Admitting: Emergency Medicine

## 2021-05-26 ENCOUNTER — Other Ambulatory Visit: Payer: Self-pay

## 2021-05-26 ENCOUNTER — Encounter (HOSPITAL_BASED_OUTPATIENT_CLINIC_OR_DEPARTMENT_OTHER): Payer: Self-pay | Admitting: Emergency Medicine

## 2021-05-26 DIAGNOSIS — R7309 Other abnormal glucose: Secondary | ICD-10-CM | POA: Diagnosis not present

## 2021-05-26 DIAGNOSIS — R5381 Other malaise: Secondary | ICD-10-CM | POA: Insufficient documentation

## 2021-05-26 DIAGNOSIS — Z79899 Other long term (current) drug therapy: Secondary | ICD-10-CM | POA: Diagnosis not present

## 2021-05-26 DIAGNOSIS — E860 Dehydration: Secondary | ICD-10-CM | POA: Diagnosis not present

## 2021-05-26 DIAGNOSIS — I10 Essential (primary) hypertension: Secondary | ICD-10-CM | POA: Diagnosis not present

## 2021-05-26 DIAGNOSIS — R5383 Other fatigue: Secondary | ICD-10-CM

## 2021-05-26 DIAGNOSIS — Z20822 Contact with and (suspected) exposure to covid-19: Secondary | ICD-10-CM | POA: Diagnosis not present

## 2021-05-26 LAB — BASIC METABOLIC PANEL
Anion gap: 9 (ref 5–15)
BUN: 17 mg/dL (ref 6–20)
CO2: 27 mmol/L (ref 22–32)
Calcium: 9.8 mg/dL (ref 8.9–10.3)
Chloride: 100 mmol/L (ref 98–111)
Creatinine, Ser: 1.16 mg/dL (ref 0.61–1.24)
GFR, Estimated: >60 mL/min (ref >60)
Glucose, Bld: 106 mg/dL — ABNORMAL HIGH (ref 70–99)
Potassium: 3.7 mmol/L (ref 3.5–5.1)
Sodium: 136 mmol/L (ref 135–145)

## 2021-05-26 LAB — CBC WITH DIFFERENTIAL/PLATELET
Abs Immature Granulocytes: 0.02 10*3/uL (ref 0.00–0.07)
Basophils Absolute: 0 10*3/uL (ref 0.0–0.1)
Basophils Relative: 1 %
Eosinophils Absolute: 0.1 10*3/uL (ref 0.0–0.5)
Eosinophils Relative: 1 %
HCT: 40.9 % (ref 39.0–52.0)
Hemoglobin: 14 g/dL (ref 13.0–17.0)
Immature Granulocytes: 0 %
Lymphocytes Relative: 29 %
Lymphs Abs: 2.2 10*3/uL (ref 0.7–4.0)
MCH: 30.7 pg (ref 26.0–34.0)
MCHC: 34.2 g/dL (ref 30.0–36.0)
MCV: 89.7 fL (ref 80.0–100.0)
Monocytes Absolute: 0.8 10*3/uL (ref 0.1–1.0)
Monocytes Relative: 10 %
Neutro Abs: 4.4 10*3/uL (ref 1.7–7.7)
Neutrophils Relative %: 59 %
Platelets: 305 10*3/uL (ref 150–400)
RBC: 4.56 MIL/uL (ref 4.22–5.81)
RDW: 13.5 % (ref 11.5–15.5)
WBC: 7.5 10*3/uL (ref 4.0–10.5)
nRBC: 0 % (ref 0.0–0.2)

## 2021-05-26 LAB — URINALYSIS, ROUTINE W REFLEX MICROSCOPIC
Bilirubin Urine: NEGATIVE
Glucose, UA: NEGATIVE mg/dL
Hgb urine dipstick: NEGATIVE
Ketones, ur: NEGATIVE mg/dL
Leukocytes,Ua: NEGATIVE
Nitrite: NEGATIVE
Protein, ur: NEGATIVE mg/dL
Specific Gravity, Urine: 1.025 (ref 1.005–1.030)
pH: 5.5 (ref 5.0–8.0)

## 2021-05-26 LAB — CBG MONITORING, ED: Glucose-Capillary: 127 mg/dL — ABNORMAL HIGH (ref 70–99)

## 2021-05-26 LAB — RESP PANEL BY RT-PCR (FLU A&B, COVID) ARPGX2
Influenza A by PCR: NEGATIVE
Influenza B by PCR: NEGATIVE
SARS Coronavirus 2 by RT PCR: NEGATIVE

## 2021-05-26 MED ORDER — KETOROLAC TROMETHAMINE 15 MG/ML IJ SOLN
15.0000 mg | Freq: Once | INTRAMUSCULAR | Status: AC
  Administered 2021-05-26: 15 mg via INTRAVENOUS
  Filled 2021-05-26: qty 1

## 2021-05-26 MED ORDER — SODIUM CHLORIDE 0.9 % IV BOLUS
1000.0000 mL | Freq: Once | INTRAVENOUS | Status: AC
  Administered 2021-05-26: 1000 mL via INTRAVENOUS

## 2021-05-26 NOTE — ED Triage Notes (Signed)
Reports dry eyes, dry mouth and feels weak reports going on for the last two weeks but getting worse.  Thinks he is dehydrated.   ?

## 2021-05-26 NOTE — Discharge Instructions (Signed)
It was a pleasure caring for you today in the emergency department. ° °Please return to the emergency department for any worsening or worrisome symptoms. ° ° °

## 2021-05-26 NOTE — ED Provider Notes (Signed)
?[facility] EMERGENCY DEPARTMENT ?Provider Note ? ? ?CSN: [id number] ?Arrival date & time: 05/26/21  1139 ? ?  ? ?History ? ?Chief Complaint  ?Patient presents with  ? Dehydration  ? ? ?Daniel Kelley is a 36 y.o. male. ? ?This is a 36 y.o. male with significant medical history as below, including hypertension, hyperlipidemia who presents to the ED with complaint of malaise.  Patient reports 2 weeks of sensation of malaise, feeling unwell.  He feels he might be dehydrated.  He is drinking liquids, no nausea or vomiting, no change in bowel or bladder function.  No diarrhea.  No constipation.  No abdominal pain.  No rashes.  No alcohol use, no IV drug use.  No recent medication changes.  Reports he is compliant with home medications.  No trauma.  No recent travel or sick contacts.  No leg swelling.  No dyspnea or chest pain ? ? ?Past Medical History: ?No date: Hypertension ?10/15/2019: Mixed dyslipidemia ?No date: Tonsillitis ? ?Past Surgical History: ?12/2017: FRACTURE SURGERY ?    Comment:  right hand  ?12/08/2017: I & D EXTREMITY; Right ?    Comment:  Procedure: IRRIGATION AND DEBRIDEMENT EXTREMITY, RIGHT  ?             HAND;  Surgeon: Roseanne Kaufman, MD;  Location: [facility];   ?             Service: Orthopedics;  Laterality: Right; ?No date: NO PAST SURGERIES ?10/01/2015: TONSILLECTOMY; N/A ?    Comment:  Procedure: TONSILLECTOMY;  Surgeon: Leonides Sake  ?             Lucia Gaskins, MD;  Location: [facility];   ?             Service: ENT;  Laterality: N/A;  ? ? ?The history is provided by the patient. No language interpreter was used.  ? ?  ? ?Home Medications ?Prior to Admission medications   ?Medication Sig Start Date End Date Taking? Authorizing Provider  ?amLODipine (NORVASC) 10 MG tablet TAKE 1 TABLET BY MOUTH EVERY DAY 10/27/20   Vevelyn Francois, NP  ?hydrochlorothiazide (HYDRODIURIL) 25 MG tablet Take 1 tablet (25 mg total) by mouth daily. 07/16/20   Vevelyn Francois, NP  ?losartan (COZAAR) 50 MG  tablet Take 1 tablet (50 mg total) by mouth in the morning and at bedtime. 08/30/20 08/30/21  Vevelyn Francois, NP  ?rosuvastatin (CRESTOR) 10 MG tablet TAKE 1 TABLET BY MOUTH EVERY DAY 09/27/20   Vevelyn Francois, NP  ?sildenafil (VIAGRA) 100 MG tablet Take 0.5-1 tablets (50-100 mg total) by mouth daily as needed for erectile dysfunction. 08/30/20   Vevelyn Francois, NP  ?testosterone cypionate (DEPOTESTOSTERONE CYPIONATE) 200 MG/ML injection Inject 1 mL (200 mg total) into the muscle every 28 (twenty-eight) days. 09/30/20   Vevelyn Francois, NP  ?   ? ?Allergies    ?Shrimp [shellfish allergy], Penicillins, and Shrimp extract allergy skin test   ? ?Review of Systems   ?Review of Systems  ?Constitutional:  Positive for fatigue. Negative for chills and fever.  ?HENT:  Negative for facial swelling and trouble swallowing.   ?Eyes:  Negative for photophobia and visual disturbance.  ?Respiratory:  Negative for cough and shortness of breath.   ?Cardiovascular:  Negative for chest pain and palpitations.  ?Gastrointestinal:  Negative for abdominal pain, nausea and vomiting.  ?Endocrine: Negative for polydipsia and polyuria.  ?Genitourinary:  Negative for difficulty urinating and hematuria.  ?  Musculoskeletal:  Negative for gait problem and joint swelling.  ?Skin:  Negative for pallor and rash.  ?Neurological:  Negative for syncope and headaches.  ?Psychiatric/Behavioral:  Negative for agitation and confusion.   ? ?Physical Exam ?Updated Vital Signs ?BP 127/75   Pulse 69   Temp 98 ?F (36.7 ?C) (Oral)   Resp 17   Ht 5\' 6"  (1.676 m)   Wt 113.4 kg   SpO2 99%   BMI 40.35 kg/m?  ?Physical Exam ?Vitals and nursing note reviewed.  ?Constitutional:   ?   General: He is not in acute distress. ?   Appearance: Normal appearance. He is well-developed. He is obese. He is not diaphoretic.  ?HENT:  ?   Head: Normocephalic and atraumatic.  ?   Right Ear: External ear normal.  ?   Left Ear: External ear normal.  ?   Mouth/Throat:  ?   Mouth:  Mucous membranes are moist.  ?Eyes:  ?   General: No scleral icterus. ?   Extraocular Movements: Extraocular movements intact.  ?   Pupils: Pupils are equal, round, and reactive to light.  ?Cardiovascular:  ?   Rate and Rhythm: Normal rate and regular rhythm.  ?   Pulses: Normal pulses.  ?   Heart sounds: Normal heart sounds.  ?Pulmonary:  ?   Effort: Pulmonary effort is normal. No respiratory distress.  ?   Breath sounds: Normal breath sounds.  ?Abdominal:  ?   General: Abdomen is flat.  ?   Palpations: Abdomen is soft.  ?   Tenderness: There is no abdominal tenderness. There is no guarding or rebound.  ?Musculoskeletal:     ?   General: Normal range of motion.  ?   Cervical back: Normal range of motion.  ?   Right lower leg: No edema.  ?   Left lower leg: No edema.  ?Skin: ?   General: Skin is warm and dry.  ?   Capillary Refill: Capillary refill takes less than 2 seconds.  ?Neurological:  ?   Mental Status: He is alert and oriented to person, place, and time.  ?   GCS: GCS eye subscore is 4. GCS verbal subscore is 5. GCS motor subscore is 6.  ?   Cranial Nerves: Cranial nerves 2-12 are intact.  ?   Sensory: Sensation is intact.  ?   Motor: Motor function is intact.  ?   Coordination: Coordination is intact.  ?   Gait: Gait is intact.  ?Psychiatric:     ?   Mood and Affect: Mood normal.     ?   Behavior: Behavior normal.  ? ? ?ED Results / Procedures / Treatments   ?Labs ?(all labs ordered are listed, but only abnormal results are displayed) ?Labs Reviewed  ?BASIC METABOLIC PANEL - Abnormal; Notable for the following components:  ?    Result Value  ? Glucose, Bld 106 (*)   ? All other components within normal limits  ?CBG MONITORING, ED - Abnormal; Notable for the following components:  ? Glucose-Capillary 127 (*)   ? All other components within normal limits  ?RESP PANEL BY RT-PCR (FLU A&B, COVID) ARPGX2  ?URINALYSIS, ROUTINE W REFLEX MICROSCOPIC  ?CBC WITH DIFFERENTIAL/PLATELET  ? ? ?EKG ?None ? ?Radiology ?No  results found. ? ?Procedures ?Procedures  ? ? ?Medications Ordered in ED ?Medications  ?sodium chloride 0.9 % bolus 1,000 mL (0 mLs Intravenous Stopped 05/26/21 1411)  ?sodium chloride 0.9 % bolus 1,000 mL (0 mLs Intravenous  Stopped 05/26/21 1516)  ?ketorolac (TORADOL) 15 MG/ML injection 15 mg (15 mg Intravenous Given 05/26/21 1416)  ? ? ?ED Course/ Medical Decision Making/ A&P ?  ?                        ?Medical Decision Making ?Amount and/or Complexity of Data Reviewed ?Labs: ordered. ? ?Risk ?Prescription drug management. ? ? ?Initial Impression and Ddx ?This patient presents to the Emergency Department for the above complaint. This involves an extensive number of treatment options and is a complaint that carries with it a high risk of complications and morbidity. Vital signs were reviewed.  ? ?Serious etiologies considered. Ddx includes but is not limited to: Dehydration, URI, electrolyte disturbance, metabolic ? ?Patient PMH that increases complexity of ED encounter: None ? ?Social determinants of health include none ? ?Previous records obtained and reviewed  ? ?Interpretation of Diagnostics ?Labs & imaging results that were available during my care of the patient were visualized by me and considered in my medical decision making. ?  ?PDMP reviewed  ? ? ?Patient Reassessment and Ultimate Disposition/Management ? ? ? ?  ? ? ?Labs reviewed and are stable.  Patient reports feeling much better after IV fluids.  Advise he follow-up with PCP.  At this time I doubt acute medical condition as etiology of his complaints today.  HDS.  Ambulatory with steady gait.  Neuro exam is nonfocal ? ?The patient improved significantly and was discharged in stable condition. Detailed discussions were had with the patient regarding current findings, and need for close f/u with PCP or on call doctor. The patient has been instructed to return immediately if the symptoms worsen in any way for re-evaluation. Patient verbalized  understanding and is in agreement with current care plan. All questions answered prior to discharge. ? ? ? ? ? ? ? ? ? ? ? ? ? ? ? ? ? ? ? ? ?Complexity of Problems Addressed ?Acute illness or injury that poses threat of life

## 2021-08-19 ENCOUNTER — Encounter: Payer: Self-pay | Admitting: Nurse Practitioner

## 2021-08-19 ENCOUNTER — Ambulatory Visit: Payer: BC Managed Care – PPO | Admitting: Nurse Practitioner

## 2021-08-19 VITALS — BP 126/78 | HR 119 | Temp 97.3°F | Ht 66.0 in | Wt 252.8 lb

## 2021-08-19 DIAGNOSIS — I10 Essential (primary) hypertension: Secondary | ICD-10-CM

## 2021-08-19 DIAGNOSIS — R37 Sexual dysfunction, unspecified: Secondary | ICD-10-CM | POA: Diagnosis not present

## 2021-08-19 MED ORDER — AMLODIPINE BESYLATE 10 MG PO TABS: 10.0000 mg | ORAL_TABLET | Freq: Every day | ORAL | 3 refills | Status: DC

## 2021-08-19 MED ORDER — HYDROCHLOROTHIAZIDE 25 MG PO TABS: 25.0000 mg | ORAL_TABLET | Freq: Every day | ORAL | 3 refills | Status: DC

## 2021-08-19 MED ORDER — LOSARTAN POTASSIUM 50 MG PO TABS: 50.0000 mg | ORAL_TABLET | Freq: Two times a day (BID) | ORAL | 3 refills | Status: DC

## 2021-08-19 NOTE — Patient Instructions (Signed)
You were seen today in the [facility] for reevaluation of chronic illness. Labs were collected, results will be available via MyChart or, if abnormal, you will be contacted by clinic staff. You were prescribed medications, please take as directed. Please follow up in 3 mths for reevaluation of HTN

## 2021-08-19 NOTE — Progress Notes (Unsigned)
[facility] [place] [facility], [place]  [address] Phone:  [phone]   Fax:  [phone] Subjective:   Patient ID: Daniel Kelley, male    DOB: 12-30-1985, 36 y.o.   MRN: [id number]  Chief Complaint  Patient presents with   Medication Refill    Pt is here for medication refill for BP and also wanted to know about his Testerone levels. Pt is requesting a refill on all medications   HPI Daniel Kelley 36 y.o. male  has a past medical history of Hypertension, Mixed dyslipidemia (10/15/2019), and Tonsillitis. To the [facility] for reevaluation of chronic illness.  Patient requesting refill of all medications, including B/P medication. Last visit with PCP 1 yr ago. Last dose of B/P medication this morning. Denies monitoring meals and/ exercising regularly. Checks B/P at home, with values similar to today. Denies any other concerns today.   Denies any fatigue, chest pain, shortness of breath, HA or dizziness. Denies any blurred vision, numbness or tingling.  Past Medical History:  Diagnosis Date   Hypertension    Mixed dyslipidemia 10/15/2019   Tonsillitis     Past Surgical History:  Procedure Laterality Date   FRACTURE SURGERY  12/2017   right hand    I & D EXTREMITY Right 12/08/2017   Procedure: IRRIGATION AND DEBRIDEMENT EXTREMITY, RIGHT HAND;  Surgeon: Roseanne Kaufman, MD;  Location: [place];  Service: Orthopedics;  Laterality: Right;   NO PAST SURGERIES     TONSILLECTOMY N/A 10/01/2015   Procedure: TONSILLECTOMY;  Surgeon: Rozetta Nunnery, MD;  Location: [place];  Service: ENT;  Laterality: N/A;    No family history on file.  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  Substance and Sexual Activity   Alcohol use: Yes    Alcohol/week: 4.0 standard drinks of alcohol    Types: 3 Shots of liquor, 1 Cans of beer per week    Comment: daily use   Drug use: No   Sexual activity: Not on file  Other Topics Concern   Not on file  Social History Narrative   Not on file   Social Determinants of Health   Financial Resource Strain: Not on file  Food Insecurity: Not on file  Transportation Needs: Not on file  Physical Activity: Not on file  Stress: Not on file  Social Connections: Not on file  Intimate Partner Violence: Not on file    Outpatient Medications Prior to Visit  Medication Sig Dispense Refill   rosuvastatin (CRESTOR) 10 MG tablet TAKE 1 TABLET BY MOUTH EVERY DAY 90 tablet 3   sildenafil (VIAGRA) 100 MG tablet Take 0.5-1 tablets (50-100 mg total) by mouth daily as needed for erectile dysfunction. 10 tablet 0   testosterone cypionate (DEPOTESTOSTERONE CYPIONATE) 200 MG/ML injection Inject 1 mL (200 mg total) into the muscle every 28 (twenty-eight) days. 10 mL 0   amLODipine (NORVASC) 10 MG tablet TAKE 1 TABLET BY MOUTH EVERY DAY 90 tablet 3   hydrochlorothiazide (HYDRODIURIL) 25 MG tablet Take 1 tablet (25 mg total) by mouth daily. 90 tablet 3  losartan (COZAAR) 50 MG tablet Take 1 tablet (50 mg total) by mouth in the morning and at bedtime. 180 tablet 3   No facility-administered medications prior to visit.    Allergies  Allergen Reactions   Shrimp [Shellfish Allergy] Hives, Itching, Swelling and Rash    Not anaphalaxis   Penicillins    Shrimp Extract Allergy Skin Test     Review of Systems  Constitutional:  Negative for chills, fever and malaise/fatigue.  Eyes: Negative.   Respiratory:  Negative for cough and shortness of breath.   Cardiovascular:  Negative for chest pain, palpitations and leg swelling.  Gastrointestinal:  Negative for abdominal pain, blood in stool, constipation, diarrhea, nausea and  vomiting.  Musculoskeletal: Negative.   Skin: Negative.   Neurological: Negative.   Psychiatric/Behavioral:  Negative for depression. The patient is not nervous/anxious.   All other systems reviewed and are negative.      Objective:    Physical Exam Vitals reviewed.  Constitutional:      General: He is not in acute distress.    Appearance: Normal appearance. He is obese.  HENT:     Head: Normocephalic.  Eyes:     General: No scleral icterus.       Right eye: No discharge.        Left eye: No discharge.     Extraocular Movements: Extraocular movements intact.     Conjunctiva/sclera: Conjunctivae normal.     Pupils: Pupils are equal, round, and reactive to light.  Neck:     Vascular: No carotid bruit.  Cardiovascular:     Rate and Rhythm: Normal rate and regular rhythm.     Pulses: Normal pulses.     Heart sounds: Normal heart sounds.     Comments: No obvious peripheral edema Pulmonary:     Effort: Pulmonary effort is normal.     Breath sounds: Normal breath sounds.  Musculoskeletal:        General: No swelling, tenderness, deformity or signs of injury. Normal range of motion.     Cervical back: Normal range of motion and neck supple. No rigidity or tenderness.     Right lower leg: No edema.     Left lower leg: No edema.  Lymphadenopathy:     Cervical: No cervical adenopathy.  Skin:    General: Skin is warm and dry.     Capillary Refill: Capillary refill takes less than 2 seconds.  Neurological:     General: No focal deficit present.     Mental Status: He is alert and oriented to person, place, and time.  Psychiatric:        Mood and Affect: Mood normal.        Behavior: Behavior normal.        Thought Content: Thought content normal.        Judgment: Judgment normal.     BP 126/78 (BP Location: Right Arm, Patient Position: Sitting, Cuff Size: Large)   Pulse (!) 119   Temp (!) 97.3 F (36.3 C)   Ht 5' 6" (1.676 m)   Wt 252 lb 12.8 oz (114.7 kg)   SpO2 100%    BMI 40.80 kg/m  Wt Readings from Last 3 Encounters:  08/19/21 252 lb 12.8 oz (114.7 kg)  05/26/21 250 lb (113.4 kg)  08/30/20 253 lb 0.8 oz (114.8 kg)    Immunization History  Administered Date(s) Administered   Influenza,inj,Quad PF,6+ Mos 02/25/2018   Tdap 08/23/2017    Diabetic Foot Exam - Simple  No data filed     No results found for: "TSH" Lab Results  Component Value Date   WBC 9.2 08/19/2021   HGB 14.4 08/19/2021   HCT 41.1 08/19/2021   MCV 88 08/19/2021   PLT 379 08/19/2021   Lab Results  Component Value Date   NA 140 08/19/2021   K 4.1 08/19/2021   CO2 23 08/19/2021   GLUCOSE 110 (H) 08/19/2021   BUN 17 08/19/2021   CREATININE 1.29 (H) 08/19/2021   BILITOT 0.2 08/19/2021   ALKPHOS 65 08/19/2021   AST 34 08/19/2021   ALT 31 08/19/2021   PROT 7.9 08/19/2021   ALBUMIN 5.2 (H) 08/19/2021   CALCIUM 10.2 08/19/2021   ANIONGAP 9 05/26/2021   EGFR 73 08/19/2021   Lab Results  Component Value Date   CHOL 242 (H) 08/19/2021   CHOL 161 07/16/2020   CHOL 248 (H) 10/10/2019   Lab Results  Component Value Date   HDL 48 08/19/2021   HDL 52 07/16/2020   HDL 46 10/10/2019   Lab Results  Component Value Date   LDLCALC 131 (H) 08/19/2021   LDLCALC 73 07/16/2020   LDLCALC 168 (H) 10/10/2019   Lab Results  Component Value Date   TRIG 353 (H) 08/19/2021   TRIG 223 (H) 07/16/2020   TRIG 183 (H) 10/10/2019   Lab Results  Component Value Date   CHOLHDL 5.0 08/19/2021   CHOLHDL 3.1 07/16/2020   CHOLHDL 5.4 (H) 10/10/2019   Lab Results  Component Value Date   HGBA1C 5.5 09/17/2019   HGBA1C 5.5 09/17/2019   HGBA1C 5.5 (A) 09/17/2019   HGBA1C 5.5 09/17/2019       Assessment & Plan:   Problem List Items Addressed This Visit       Cardiovascular and Mediastinum   Essential hypertension   Relevant Medications   amLODipine (NORVASC) 10 MG tablet, increased dosage    hydrochlorothiazide (HYDRODIURIL) 25 MG tablet   losartan (COZAAR) 50 MG  tablet All other medications refilled without change    Other Relevant Orders   Comprehensive metabolic panel (Completed)   CBC with Differential/Platelet (Completed)   Lipid panel (Completed) Encouraged continued diet and exercise efforts  Encouraged continued compliance with medication     Other Visit Diagnoses     Sexual dysfunction    -  Primary   Relevant Orders   Testosterone (Completed) Discussed non pharmacological methods for management of symptoms Informed to take OTC medications as needed Discussed possible causes    Follow up in 42mhs for reevaluation of HTN, sooner as needed     I have changed Daniel Kelley's amLODipine. I am also having him maintain his sildenafil, rosuvastatin, testosterone cypionate, hydrochlorothiazide, and losartan.  Meds ordered this encounter  Medications   amLODipine (NORVASC) 10 MG tablet    Sig: Take 1 tablet (10 mg total) by mouth daily.    Dispense:  90 tablet    Refill:  3   hydrochlorothiazide (HYDRODIURIL) 25 MG tablet    Sig: Take 1 tablet (25 mg total) by mouth daily.    Dispense:  90 tablet    Refill:  3   losartan (COZAAR) 50 MG tablet    Sig: Take 1 tablet (50 mg total) by mouth in the morning and at bedtime.    Dispense:  180 tablet    Refill:  3     TTeena Dunk NP

## 2021-08-20 LAB — COMPREHENSIVE METABOLIC PANEL
ALT: 31 IU/L (ref 0–44)
AST: 34 IU/L (ref 0–40)
Albumin/Globulin Ratio: 1.9 (ref 1.2–2.2)
Albumin: 5.2 g/dL — ABNORMAL HIGH (ref 4.0–5.0)
Alkaline Phosphatase: 65 IU/L (ref 44–121)
BUN/Creatinine Ratio: 13 (ref 9–20)
BUN: 17 mg/dL (ref 6–20)
Bilirubin Total: 0.2 mg/dL (ref 0.0–1.2)
CO2: 23 mmol/L (ref 20–29)
Calcium: 10.2 mg/dL (ref 8.7–10.2)
Chloride: 98 mmol/L (ref 96–106)
Creatinine, Ser: 1.29 mg/dL — ABNORMAL HIGH (ref 0.76–1.27)
Globulin, Total: 2.7 g/dL (ref 1.5–4.5)
Glucose: 110 mg/dL — ABNORMAL HIGH (ref 70–99)
Potassium: 4.1 mmol/L (ref 3.5–5.2)
Sodium: 140 mmol/L (ref 134–144)
Total Protein: 7.9 g/dL (ref 6.0–8.5)
eGFR: 73 mL/min/{1.73_m2} (ref >59)

## 2021-08-20 LAB — CBC WITH DIFFERENTIAL/PLATELET
Basophils Absolute: 0.1 10*3/uL (ref 0.0–0.2)
Basos: 1 %
EOS (ABSOLUTE): 0 10*3/uL (ref 0.0–0.4)
Eos: 0 %
Hematocrit: 41.1 % (ref 37.5–51.0)
Hemoglobin: 14.4 g/dL (ref 13.0–17.7)
Immature Grans (Abs): 0 10*3/uL (ref 0.0–0.1)
Immature Granulocytes: 0 %
Lymphocytes Absolute: 2.4 10*3/uL (ref 0.7–3.1)
Lymphs: 26 %
MCH: 30.8 pg (ref 26.6–33.0)
MCHC: 35 g/dL (ref 31.5–35.7)
MCV: 88 fL (ref 79–97)
Monocytes Absolute: 0.6 10*3/uL (ref 0.1–0.9)
Monocytes: 6 %
Neutrophils Absolute: 6.1 10*3/uL (ref 1.4–7.0)
Neutrophils: 67 %
Platelets: 379 10*3/uL (ref 150–450)
RBC: 4.67 x10E6/uL (ref 4.14–5.80)
RDW: 13.3 % (ref 11.6–15.4)
WBC: 9.2 10*3/uL (ref 3.4–10.8)

## 2021-08-20 LAB — LIPID PANEL
Chol/HDL Ratio: 5 ratio (ref 0.0–5.0)
Cholesterol, Total: 242 mg/dL — ABNORMAL HIGH (ref 100–199)
HDL: 48 mg/dL (ref >39)
LDL Chol Calc (NIH): 131 mg/dL — ABNORMAL HIGH (ref 0–99)
Triglycerides: 353 mg/dL — ABNORMAL HIGH (ref 0–149)
VLDL Cholesterol Cal: 63 mg/dL — ABNORMAL HIGH (ref 5–40)

## 2021-08-20 LAB — TESTOSTERONE: Testosterone: 113 ng/dL — ABNORMAL LOW (ref 264–916)

## 2021-08-30 ENCOUNTER — Encounter (HOSPITAL_BASED_OUTPATIENT_CLINIC_OR_DEPARTMENT_OTHER): Payer: Self-pay

## 2021-08-30 ENCOUNTER — Emergency Department (HOSPITAL_BASED_OUTPATIENT_CLINIC_OR_DEPARTMENT_OTHER): Payer: BC Managed Care – PPO

## 2021-08-30 ENCOUNTER — Emergency Department (HOSPITAL_BASED_OUTPATIENT_CLINIC_OR_DEPARTMENT_OTHER)
Admission: EM | Admit: 2021-08-30 | Discharge: 2021-08-30 | Disposition: A | Discharge status: Home / Self Care | Payer: BC Managed Care – PPO | Attending: Emergency Medicine | Admitting: Emergency Medicine

## 2021-08-30 DIAGNOSIS — Z20822 Contact with and (suspected) exposure to covid-19: Secondary | ICD-10-CM | POA: Insufficient documentation

## 2021-08-30 DIAGNOSIS — R509 Fever, unspecified: Secondary | ICD-10-CM | POA: Diagnosis not present

## 2021-08-30 DIAGNOSIS — R0602 Shortness of breath: Secondary | ICD-10-CM | POA: Diagnosis not present

## 2021-08-30 DIAGNOSIS — J069 Acute upper respiratory infection, unspecified: Secondary | ICD-10-CM | POA: Insufficient documentation

## 2021-08-30 DIAGNOSIS — R000 Tachycardia, unspecified: Secondary | ICD-10-CM | POA: Diagnosis not present

## 2021-08-30 DIAGNOSIS — B9789 Other viral agents as the cause of diseases classified elsewhere: Secondary | ICD-10-CM | POA: Diagnosis not present

## 2021-08-30 DIAGNOSIS — R059 Cough, unspecified: Secondary | ICD-10-CM | POA: Diagnosis not present

## 2021-08-30 LAB — CBC WITH DIFFERENTIAL/PLATELET
Abs Immature Granulocytes: 0.06 10*3/uL (ref 0.00–0.07)
Basophils Absolute: 0 10*3/uL (ref 0.0–0.1)
Basophils Relative: 0 %
Eosinophils Absolute: 0 10*3/uL (ref 0.0–0.5)
Eosinophils Relative: 0 %
HCT: 39.6 % (ref 39.0–52.0)
Hemoglobin: 13.7 g/dL (ref 13.0–17.0)
Immature Granulocytes: 1 %
Lymphocytes Relative: 10 %
Lymphs Abs: 1.2 10*3/uL (ref 0.7–4.0)
MCH: 30.9 pg (ref 26.0–34.0)
MCHC: 34.6 g/dL (ref 30.0–36.0)
MCV: 89.4 fL (ref 80.0–100.0)
Monocytes Absolute: 1.3 10*3/uL — ABNORMAL HIGH (ref 0.1–1.0)
Monocytes Relative: 11 %
Neutro Abs: 9.1 10*3/uL — ABNORMAL HIGH (ref 1.7–7.7)
Neutrophils Relative %: 78 %
Platelets: 302 10*3/uL (ref 150–400)
RBC: 4.43 MIL/uL (ref 4.22–5.81)
RDW: 13.2 % (ref 11.5–15.5)
WBC: 11.7 10*3/uL — ABNORMAL HIGH (ref 4.0–10.5)
nRBC: 0 % (ref 0.0–0.2)

## 2021-08-30 LAB — COMPREHENSIVE METABOLIC PANEL
ALT: 46 U/L — ABNORMAL HIGH (ref 0–44)
AST: 54 U/L — ABNORMAL HIGH (ref 15–41)
Albumin: 4.2 g/dL (ref 3.5–5.0)
Alkaline Phosphatase: 56 U/L (ref 38–126)
Anion gap: 10 (ref 5–15)
BUN: 13 mg/dL (ref 6–20)
CO2: 28 mmol/L (ref 22–32)
Calcium: 9.2 mg/dL (ref 8.9–10.3)
Chloride: 96 mmol/L — ABNORMAL LOW (ref 98–111)
Creatinine, Ser: 1.42 mg/dL — ABNORMAL HIGH (ref 0.61–1.24)
GFR, Estimated: >60 mL/min (ref >60)
Glucose, Bld: 124 mg/dL — ABNORMAL HIGH (ref 70–99)
Potassium: 3.2 mmol/L — ABNORMAL LOW (ref 3.5–5.1)
Sodium: 134 mmol/L — ABNORMAL LOW (ref 135–145)
Total Bilirubin: 0.5 mg/dL (ref 0.3–1.2)
Total Protein: 8.9 g/dL — ABNORMAL HIGH (ref 6.5–8.1)

## 2021-08-30 LAB — RESP PANEL BY RT-PCR (FLU A&B, COVID) ARPGX2
Influenza A by PCR: NEGATIVE
Influenza B by PCR: NEGATIVE
SARS Coronavirus 2 by RT PCR: NEGATIVE

## 2021-08-30 MED ORDER — IBUPROFEN 400 MG PO TABS
600.0000 mg | ORAL_TABLET | Freq: Once | ORAL | Status: AC
  Administered 2021-08-30: 600 mg via ORAL
  Filled 2021-08-30: qty 1

## 2021-08-30 MED ORDER — SODIUM CHLORIDE 0.9 % IV BOLUS
1000.0000 mL | Freq: Once | INTRAVENOUS | Status: AC
Start: 2021-08-30 — End: 2021-08-30
  Administered 2021-08-30: 1000 mL via INTRAVENOUS

## 2021-08-30 MED ORDER — ACETAMINOPHEN 500 MG PO TABS
1000.0000 mg | ORAL_TABLET | Freq: Once | ORAL | Status: AC | PRN
  Administered 2021-08-30: 1000 mg via ORAL
  Filled 2021-08-30: qty 2

## 2021-08-30 NOTE — ED Notes (Signed)
Pt ambulatory on pulse ox, states feels fatigued. SpO2 maintained at 94%, HR increased to 129.

## 2021-08-30 NOTE — ED Provider Notes (Signed)
[facility] EMERGENCY DEPARTMENT Provider Note   CSN: [id number] Arrival date & time: 08/30/21  1207     History  Chief Complaint  Patient presents with   Shortness of Breath    Daniel Kelley is a 36 y.o. male.  Presents emerged department due to concern for cough, malaise, fatigue.  States symptoms have been going on since Friday.  Initially just felt generally unwell and fatigued.  Then had cough.  Then had fever.  Had been taking Motrin at home, last dose was early this morning.  No other obvious alleviating or aggravating factors.  Does have some shortness of breath with the cough, particularly during coughing spells.  No chest pain.  Currently he denies ongoing shortness of breath.  He denies any major medical problems.  Per review of chart he does take antihypertensive.  HPI     Home Medications Prior to Admission medications   Medication Sig Start Date End Date Taking? Authorizing Provider  amLODipine (NORVASC) 10 MG tablet Take 1 tablet (10 mg total) by mouth daily. 08/19/21   Passmore, Enid Derry I, NP  hydrochlorothiazide (HYDRODIURIL) 25 MG tablet Take 1 tablet (25 mg total) by mouth daily. 08/19/21   Passmore, Enid Derry I, NP  losartan (COZAAR) 50 MG tablet Take 1 tablet (50 mg total) by mouth in the morning and at bedtime. 08/19/21 08/19/22  Orion Crook I, NP  rosuvastatin (CRESTOR) 10 MG tablet TAKE 1 TABLET BY MOUTH EVERY DAY 09/27/20   Barbette Merino, NP  sildenafil (VIAGRA) 100 MG tablet Take 0.5-1 tablets (50-100 mg total) by mouth daily as needed for erectile dysfunction. 08/30/20   Barbette Merino, NP  testosterone cypionate (DEPOTESTOSTERONE CYPIONATE) 200 MG/ML injection Inject 1 mL (200 mg total) into the muscle every 28 (twenty-eight) days. 09/30/20   Barbette Merino, NP      Allergies    Shrimp [shellfish allergy], Penicillins, and Shrimp extract allergy skin test    Review of Systems   Review of Systems  Constitutional:  Positive for chills, fatigue and  fever.  HENT:  Negative for ear pain and sore throat.   Eyes:  Negative for pain and visual disturbance.  Respiratory:  Positive for cough. Negative for shortness of breath.   Cardiovascular:  Negative for chest pain and palpitations.  Gastrointestinal:  Negative for abdominal pain and vomiting.  Genitourinary:  Negative for dysuria and hematuria.  Musculoskeletal:  Negative for arthralgias and back pain.  Skin:  Negative for color change and rash.  Neurological:  Negative for seizures and syncope.  All other systems reviewed and are negative.   Physical Exam Updated Vital Signs BP 107/73   Pulse 95   Temp 98.9 F (37.2 C) (Oral)   Resp (!) 26   Ht 5\' 6"  (1.676 m)   Wt 113.4 kg   SpO2 97%   BMI 40.35 kg/m  Physical Exam Vitals and nursing note reviewed.  Constitutional:      General: He is not in acute distress.    Appearance: He is well-developed.  HENT:     Head: Normocephalic and atraumatic.  Eyes:     Conjunctiva/sclera: Conjunctivae normal.  Cardiovascular:     Rate and Rhythm: Normal rate and regular rhythm.     Heart sounds: No murmur heard. Pulmonary:     Effort: Pulmonary effort is normal. No respiratory distress.     Breath sounds: Normal breath sounds.  Abdominal:     Palpations: Abdomen is soft.     Tenderness:  There is no abdominal tenderness.  Musculoskeletal:        General: No swelling.     Cervical back: Neck supple.  Skin:    General: Skin is warm and dry.     Capillary Refill: Capillary refill takes less than 2 seconds.  Neurological:     Mental Status: He is alert.  Psychiatric:        Mood and Affect: Mood normal.     ED Results / Procedures / Treatments   Labs (all labs ordered are listed, but only abnormal results are displayed) Labs Reviewed  CBC WITH DIFFERENTIAL/PLATELET - Abnormal; Notable for the following components:      Result Value   WBC 11.7 (*)    Neutro Abs 9.1 (*)    Monocytes Absolute 1.3 (*)    All other components  within normal limits  COMPREHENSIVE METABOLIC PANEL - Abnormal; Notable for the following components:   Sodium 134 (*)    Potassium 3.2 (*)    Chloride 96 (*)    Glucose, Bld 124 (*)    Creatinine, Ser 1.42 (*)    Total Protein 8.9 (*)    AST 54 (*)    ALT 46 (*)    All other components within normal limits  RESP PANEL BY RT-PCR (FLU A&B, COVID) [id number]    EKG EKG Interpretation  Date/Time:  Tuesday August 30 2021 12:20:17 EDT Ventricular Rate:  119 PR Interval:  148 QRS Duration: 96 QT Interval:  348 QTC Calculation: 489 R Axis:   -31 Text Interpretation: Sinus tachycardia Possible Left atrial enlargement Left axis deviation Incomplete right bundle branch block Abnormal ECG When compared with ECG of 14-Nov-2018 13:45, rate has increased Confirmed by Marianna Fuss ([id number]) on 08/30/2021 12:39:32 PM  Radiology DG Chest Portable 1 View  Result Date: 08/30/2021 CLINICAL DATA:  Shortness of breath.  Cough and fever. EXAM: PORTABLE CHEST 1 VIEW COMPARISON:  January 13, 2018. FINDINGS: The heart size and mediastinal contours are within normal limits. Both lungs are clear. No visible pleural effusions or pneumothorax. No acute osseous abnormality. IMPRESSION: No active disease. Electronically Signed   By: Feliberto Harts M.D.   On: 08/30/2021 13:51    Procedures Procedures    Medications Ordered in ED Medications  acetaminophen (TYLENOL) tablet 1,000 mg (1,000 mg Oral Given 08/30/21 1230)  sodium chloride 0.9 % bolus 1,000 mL (0 mLs Intravenous Stopped 08/30/21 1414)  ibuprofen (ADVIL) tablet 600 mg (600 mg Oral Given 08/30/21 1307)  sodium chloride 0.9 % bolus 1,000 mL (0 mLs Intravenous Stopped 08/30/21 1525)    ED Course/ Medical Decision Making/ A&P                           Medical Decision Making Amount and/or Complexity of Data Reviewed Labs: ordered. Radiology: ordered.  Risk OTC drugs.   37 year old male presenting to ER due to concern for fever, cough, malaise.   On physical exam he appears well in no distress.  Initial vitals patient was noted to be febrile and somewhat tachycardic.  Checked basic labs, viral testing, CXR.  He has very slight leukocytosis, very slight elevation in creatinine from prior.  Suspect component of dehydration.  Patient was provided antipyretic, fluids.  COVID, flu negative.  Independently reviewed and interpreted CXR, no acute infiltrate to suggest pneumonia.  Ultimately given patient's symptomatology and work-up today I suspect viral upper respiratory illness.  On reassessment patient feels well, denies ongoing complaint.  He has defervesced, his tachycardia has resolved.  Throughout his ER stay he has not had any hypoxia.  He denies ongoing shortness of breath and denies any chest pain.  Doubt PE.  I have reviewed return precautions with patient in detail and family at bedside.  Advised recheck with PCP.  Given the work-up today, well appearance, stable vitals, feel he is appropriate for outpatient management and does not require admission.  After the discussed management above, the patient was determined to be safe for discharge.  The patient was in agreement with this plan and all questions regarding their care were answered.  ED return precautions were discussed and the patient will return to the ED with any significant worsening of condition.         Final Clinical Impression(s) / ED Diagnoses Final diagnoses:  Viral upper respiratory illness    Rx / DC Orders ED Discharge Orders     None         Lucrezia Starch, MD 08/31/21 1521

## 2021-08-31 ENCOUNTER — Encounter (HOSPITAL_COMMUNITY): Payer: Self-pay

## 2021-08-31 ENCOUNTER — Ambulatory Visit (HOSPITAL_COMMUNITY)
Admission: EM | Admit: 2021-08-31 | Discharge: 2021-08-31 | Disposition: A | Discharge status: Home / Self Care | Payer: BC Managed Care – PPO | Attending: Family Medicine | Admitting: Family Medicine

## 2021-08-31 DIAGNOSIS — R051 Acute cough: Secondary | ICD-10-CM

## 2021-08-31 DIAGNOSIS — R062 Wheezing: Secondary | ICD-10-CM | POA: Diagnosis not present

## 2021-08-31 DIAGNOSIS — R509 Fever, unspecified: Secondary | ICD-10-CM

## 2021-08-31 MED ORDER — ACETAMINOPHEN 325 MG PO TABS
ORAL_TABLET | ORAL | Status: AC
  Filled 2021-08-31: qty 3

## 2021-08-31 MED ORDER — ACETAMINOPHEN 325 MG PO TABS
975.0000 mg | ORAL_TABLET | Freq: Once | ORAL | Status: AC
  Administered 2021-08-31: 975 mg via ORAL

## 2021-08-31 MED ORDER — PREDNISONE 20 MG PO TABS: 40.0000 mg | ORAL_TABLET | Freq: Every day | ORAL | 0 refills | Status: DC

## 2021-08-31 MED ORDER — PROMETHAZINE-DM 6.25-15 MG/5ML PO SYRP: 5.0000 mL | ORAL_SOLUTION | Freq: Four times a day (QID) | ORAL | 0 refills | Status: DC | PRN

## 2021-08-31 NOTE — ED Triage Notes (Signed)
Pt c/o fever, cough, sob, and decreased appetite since Friday. States last motrin at 645pm. States seen and tx'd in the ED last night.

## 2021-09-01 ENCOUNTER — Emergency Department (HOSPITAL_BASED_OUTPATIENT_CLINIC_OR_DEPARTMENT_OTHER): Payer: BC Managed Care – PPO

## 2021-09-01 ENCOUNTER — Encounter (HOSPITAL_BASED_OUTPATIENT_CLINIC_OR_DEPARTMENT_OTHER): Payer: Self-pay

## 2021-09-01 ENCOUNTER — Emergency Department (HOSPITAL_BASED_OUTPATIENT_CLINIC_OR_DEPARTMENT_OTHER)
Admission: EM | Admit: 2021-09-01 | Discharge: 2021-09-01 | Disposition: A | Discharge status: Home / Self Care | Payer: BC Managed Care – PPO | Attending: Emergency Medicine | Admitting: Emergency Medicine

## 2021-09-01 ENCOUNTER — Other Ambulatory Visit: Payer: Self-pay

## 2021-09-01 DIAGNOSIS — J189 Pneumonia, unspecified organism: Secondary | ICD-10-CM | POA: Diagnosis not present

## 2021-09-01 DIAGNOSIS — R509 Fever, unspecified: Secondary | ICD-10-CM | POA: Diagnosis not present

## 2021-09-01 DIAGNOSIS — J181 Lobar pneumonia, unspecified organism: Secondary | ICD-10-CM | POA: Diagnosis not present

## 2021-09-01 DIAGNOSIS — R059 Cough, unspecified: Secondary | ICD-10-CM | POA: Diagnosis not present

## 2021-09-01 LAB — COMPREHENSIVE METABOLIC PANEL
ALT: 87 U/L — ABNORMAL HIGH (ref 0–44)
AST: 71 U/L — ABNORMAL HIGH (ref 15–41)
Albumin: 3.6 g/dL (ref 3.5–5.0)
Alkaline Phosphatase: 73 U/L (ref 38–126)
Anion gap: 9 (ref 5–15)
BUN: 9 mg/dL (ref 6–20)
CO2: 25 mmol/L (ref 22–32)
Calcium: 8.6 mg/dL — ABNORMAL LOW (ref 8.9–10.3)
Chloride: 102 mmol/L (ref 98–111)
Creatinine, Ser: 1.16 mg/dL (ref 0.61–1.24)
GFR, Estimated: >60 mL/min (ref >60)
Glucose, Bld: 143 mg/dL — ABNORMAL HIGH (ref 70–99)
Potassium: 3.7 mmol/L (ref 3.5–5.1)
Sodium: 136 mmol/L (ref 135–145)
Total Bilirubin: 0.4 mg/dL (ref 0.3–1.2)
Total Protein: 7.9 g/dL (ref 6.5–8.1)

## 2021-09-01 LAB — CBC WITH DIFFERENTIAL/PLATELET
Abs Immature Granulocytes: 0.09 10*3/uL — ABNORMAL HIGH (ref 0.00–0.07)
Basophils Absolute: 0 10*3/uL (ref 0.0–0.1)
Basophils Relative: 0 %
Eosinophils Absolute: 0 10*3/uL (ref 0.0–0.5)
Eosinophils Relative: 0 %
HCT: 36.6 % — ABNORMAL LOW (ref 39.0–52.0)
Hemoglobin: 12.4 g/dL — ABNORMAL LOW (ref 13.0–17.0)
Immature Granulocytes: 1 %
Lymphocytes Relative: 7 %
Lymphs Abs: 0.8 10*3/uL (ref 0.7–4.0)
MCH: 30.2 pg (ref 26.0–34.0)
MCHC: 33.9 g/dL (ref 30.0–36.0)
MCV: 89.1 fL (ref 80.0–100.0)
Monocytes Absolute: 0.7 10*3/uL (ref 0.1–1.0)
Monocytes Relative: 6 %
Neutro Abs: 10.1 10*3/uL — ABNORMAL HIGH (ref 1.7–7.7)
Neutrophils Relative %: 86 %
Platelets: 327 10*3/uL (ref 150–400)
RBC: 4.11 MIL/uL — ABNORMAL LOW (ref 4.22–5.81)
RDW: 13.4 % (ref 11.5–15.5)
WBC: 11.7 10*3/uL — ABNORMAL HIGH (ref 4.0–10.5)
nRBC: 0 % (ref 0.0–0.2)

## 2021-09-01 MED ORDER — DOXYCYCLINE HYCLATE 100 MG PO TABS
100.0000 mg | ORAL_TABLET | Freq: Once | ORAL | Status: AC
  Administered 2021-09-01: 100 mg via ORAL
  Filled 2021-09-01: qty 1

## 2021-09-01 MED ORDER — DOXYCYCLINE HYCLATE 100 MG PO CAPS: 100.0000 mg | ORAL_CAPSULE | Freq: Two times a day (BID) | ORAL | 0 refills | Status: DC

## 2021-09-01 NOTE — ED Notes (Signed)
ED Provider at bedside. 

## 2021-09-01 NOTE — ED Provider Notes (Signed)
[facility] EMERGENCY DEPARTMENT Provider Note   CSN: [id number] Arrival date & time: 09/01/21  1258     History  Chief Complaint  Patient presents with   Fever    Daniel Kelley is a 36 y.o. male.  Patient is a 36 year old male presenting for fever and cough.  Patient states he has had fever for 6 days with Tmax of 103 F.  Patient admits to cough with green-tinged sputum.  Denies any nausea, vomiting, abdominal pain, diarrhea.  Denies any dysuria, flank pain, increased frequency, urgency.  Denies contacts.  Denies neck pain or neck stiffness.  The history is provided by the patient. No language interpreter was used.  Fever Associated symptoms: cough   Associated symptoms: no chest pain, no chills, no dysuria, no ear pain, no rash, no sore throat and no vomiting        Home Medications Prior to Admission medications   Medication Sig Start Date End Date Taking? Authorizing Provider  doxycycline (VIBRAMYCIN) 100 MG capsule Take 1 capsule (100 mg total) by mouth 2 (two) times daily. 09/01/21  Yes Edwin Dada P, DO  amLODipine (NORVASC) 10 MG tablet Take 1 tablet (10 mg total) by mouth daily. 08/19/21   Passmore, Enid Derry I, NP  hydrochlorothiazide (HYDRODIURIL) 25 MG tablet Take 1 tablet (25 mg total) by mouth daily. 08/19/21   Passmore, Enid Derry I, NP  losartan (COZAAR) 50 MG tablet Take 1 tablet (50 mg total) by mouth in the morning and at bedtime. 08/19/21 08/19/22  Passmore, Enid Derry I, NP  predniSONE (DELTASONE) 20 MG tablet Take 2 tablets (40 mg total) by mouth daily. 08/31/21   Mardella Layman, MD  promethazine-dextromethorphan (PROMETHAZINE-DM) 6.25-15 MG/5ML syrup Take 5 mLs by mouth 4 (four) times daily as needed for cough. 08/31/21   Mardella Layman, MD  rosuvastatin (CRESTOR) 10 MG tablet TAKE 1 TABLET BY MOUTH EVERY DAY 09/27/20   Barbette Merino, NP  sildenafil (VIAGRA) 100 MG tablet Take 0.5-1 tablets (50-100 mg total) by mouth daily as needed for erectile dysfunction. 08/30/20    Barbette Merino, NP  testosterone cypionate (DEPOTESTOSTERONE CYPIONATE) 200 MG/ML injection Inject 1 mL (200 mg total) into the muscle every 28 (twenty-eight) days. 09/30/20   Barbette Merino, NP      Allergies    Shrimp [shellfish allergy], Penicillins, and Shrimp extract allergy skin test    Review of Systems   Review of Systems  Constitutional:  Positive for fever. Negative for chills.  HENT:  Negative for ear pain and sore throat.   Eyes:  Negative for pain and visual disturbance.  Respiratory:  Positive for cough. Negative for shortness of breath.   Cardiovascular:  Negative for chest pain and palpitations.  Gastrointestinal:  Negative for abdominal pain and vomiting.  Genitourinary:  Negative for dysuria and hematuria.  Musculoskeletal:  Negative for arthralgias and back pain.  Skin:  Negative for color change and rash.  Neurological:  Negative for seizures and syncope.  All other systems reviewed and are negative.   Physical Exam Updated Vital Signs BP 120/70   Pulse 91   Temp 98.5 F (36.9 C) (Oral)   Resp 18   Ht 5\' 6"  (1.676 m)   Wt 117 kg   SpO2 96%   BMI 41.64 kg/m  Physical Exam Vitals and nursing note reviewed.  Constitutional:      General: He is not in acute distress.    Appearance: He is well-developed.  HENT:     Head: Normocephalic  and atraumatic.  Eyes:     Conjunctiva/sclera: Conjunctivae normal.  Cardiovascular:     Rate and Rhythm: Normal rate and regular rhythm.     Heart sounds: No murmur heard. Pulmonary:     Effort: Pulmonary effort is normal. No respiratory distress.     Breath sounds: Normal breath sounds.  Abdominal:     Palpations: Abdomen is soft.     Tenderness: There is no abdominal tenderness.  Musculoskeletal:        General: No swelling.     Cervical back: Neck supple.  Skin:    General: Skin is warm and dry.     Capillary Refill: Capillary refill takes less than 2 seconds.  Neurological:     Mental Status: He is alert.   Psychiatric:        Mood and Affect: Mood normal.     ED Results / Procedures / Treatments   Labs (all labs ordered are listed, but only abnormal results are displayed) Labs Reviewed  CBC WITH DIFFERENTIAL/PLATELET - Abnormal; Notable for the following components:      Result Value   WBC 11.7 (*)    RBC 4.11 (*)    Hemoglobin 12.4 (*)    HCT 36.6 (*)    Neutro Abs 10.1 (*)    Abs Immature Granulocytes 0.09 (*)    All other components within normal limits  COMPREHENSIVE METABOLIC PANEL - Abnormal; Notable for the following components:   Glucose, Bld 143 (*)    Calcium 8.6 (*)    AST 71 (*)    ALT 87 (*)    All other components within normal limits    EKG None  Radiology DG Chest 2 View  Result Date: 09/01/2021 CLINICAL DATA:  Fever and cough. EXAM: CHEST - 2 VIEW COMPARISON:  August 30, 2021 FINDINGS: Cardiomediastinal silhouette is normal. Mediastinal contours appear intact. Central peribronchial airspace consolidation in the right lower lobe. Osseous structures are without acute abnormality. Soft tissues are grossly normal. IMPRESSION: Findings suspicious for developing bronchopneumonia of the right lower lobe. Electronically Signed   By: Ted Mcalpine M.D.   On: 09/01/2021 13:55    Procedures Procedures    Medications Ordered in ED Medications  doxycycline (VIBRA-TABS) tablet 100 mg (has no administration in time range)    ED Course/ Medical Decision Making/ A&P                           Medical Decision Making Amount and/or Complexity of Data Reviewed Labs: ordered. Radiology: ordered.  Risk Prescription drug management.   20:33 PM 36 year old male presenting for fever and cough.  Patient is alert and oriented x3, no acute distress, afebrile, tachycardic 103 bpm with otherwise stable vital signs.  On physical exam patient feels warm, no significant findings.  Clear lung sounds bilaterally with no adventitious lung sounds.  No signs or symptoms of  meningitis.  Chest x-ray significant for developing lower lobe pneumonia.  Doxycycline given in ED.  No signs or symptoms of sepsis.  Patient otherwise appearing in no respiratory distress.  Stable for discharge with follow-up with PCP in 3 to 5 days if no improvement of symptoms.  Patient in no distress and overall condition improved here in the ED. Detailed discussions were had with the patient regarding current findings, and need for close f/u with PCP or on call doctor. The patient has been instructed to return immediately if the symptoms worsen in any way for re-evaluation.  Patient verbalized understanding and is in agreement with current care plan. All questions answered prior to discharge.          Final Clinical Impression(s) / ED Diagnoses Final diagnoses:  Community acquired pneumonia of right lower lobe of lung    Rx / DC Orders ED Discharge Orders          Ordered    doxycycline (VIBRAMYCIN) 100 MG capsule  2 times daily        09/01/21 1429              Lucia, Mcc[place], DO 09/01/21 1430

## 2021-09-01 NOTE — ED Triage Notes (Signed)
Evaluated here on 6/27 for cough/fever. States has pink tinged sputum now. Given prednisone and cough syrup at [facility] yesterday. Took motrin at 0945, tylenol at 1120.

## 2021-09-01 NOTE — ED Notes (Signed)
Patient refuses Respiratory panel swab

## 2021-09-02 ENCOUNTER — Other Ambulatory Visit: Payer: Self-pay | Admitting: Nurse Practitioner

## 2021-09-02 DIAGNOSIS — R7989 Other specified abnormal findings of blood chemistry: Secondary | ICD-10-CM

## 2021-09-02 DIAGNOSIS — E782 Mixed hyperlipidemia: Secondary | ICD-10-CM

## 2021-09-02 MED ORDER — FENOFIBRATE 40 MG PO TABS: 40.0000 mg | ORAL_TABLET | Freq: Every day | ORAL | 0 refills | Status: DC

## 2021-09-02 MED ORDER — TESTOSTERONE 12.5 MG/ACT (1%) TD GEL: 50.0000 mg | Freq: Every day | TRANSDERMAL | 2 refills | Status: DC

## 2021-09-02 MED ORDER — ROSUVASTATIN CALCIUM 20 MG PO TABS
20.0000 mg | ORAL_TABLET | Freq: Every day | ORAL | 0 refills | Status: DC
Start: 2021-09-02 — End: 2022-01-30

## 2021-09-05 ENCOUNTER — Telehealth: Payer: Self-pay

## 2021-09-05 NOTE — Telephone Encounter (Signed)
Prior authorization started for Fenofibrate 40MG  and Testosterone 12.5mg  gel waiting on patient insurance approval.

## 2021-09-07 ENCOUNTER — Telehealth: Payer: Self-pay

## 2021-09-07 ENCOUNTER — Other Ambulatory Visit: Payer: Self-pay | Admitting: Nurse Practitioner

## 2021-09-07 DIAGNOSIS — I10 Essential (primary) hypertension: Secondary | ICD-10-CM

## 2021-09-07 MED ORDER — HYDROCHLOROTHIAZIDE 25 MG PO TABS: 25.0000 mg | ORAL_TABLET | Freq: Every day | ORAL | 3 refills | Status: DC

## 2021-09-07 MED ORDER — LOSARTAN POTASSIUM 50 MG PO TABS: 50.0000 mg | ORAL_TABLET | Freq: Two times a day (BID) | ORAL | 3 refills | Status: DC

## 2021-09-16 NOTE — Telephone Encounter (Signed)
No additional notes needed  

## 2021-09-19 ENCOUNTER — Telehealth: Payer: Self-pay

## 2021-09-21 NOTE — ED Provider Notes (Signed)
[facility] CARE CENTER   [id number] 08/31/21 Arrival Time: 1941  ASSESSMENT & PLAN:  1. Acute cough   2. Fever, unspecified fever cause   3. Wheezing    No resp distress here. Discharge Medication List as of 08/31/2021  8:34 PM     START taking these medications   Details  predniSONE (DELTASONE) 20 MG tablet Take 2 tablets (40 mg total) by mouth daily., Starting Wed 08/31/2021, Normal    promethazine-dextromethorphan (PROMETHAZINE-DM) 6.25-15 MG/5ML syrup Take 5 mLs by mouth 4 (four) times daily as needed for cough., Starting Wed 08/31/2021, Normal         Follow-up Information     [facility] EMERGENCY DEPARTMENT.   Specialty: Emergency Medicine Why: If symptoms worsen in any way. Contact information: [address] [id number] mc [place] [address] [phone]                Reviewed expectations re: course of current medical issues. Questions answered. Outlined signs and symptoms indicating need for more acute intervention. Understanding verbalized. After Visit Summary given.   SUBJECTIVE: History from: Patient and Family. Daniel Kelley is a 36 y.o. male. Reports: fever and cough; ques wheezing. Seen in ED yesterday; notes reviewed. Is fatigued. No CP. Normal PO intake without n/v/d. Ambulatory.  OBJECTIVE:  Vitals:   08/31/21 2019  BP: 118/76  Pulse: (!) 125  Resp: 18  Temp: (!) 102.9 F (39.4 C)  TempSrc: Oral  SpO2: 93%    Tachycardia noted. Fever noted.  General appearance: alert; no distress; fatigued-appearing Eyes: PERRLA; EOMI; conjunctiva normal HENT: ; AT; with mild nasal congestion Neck: supple  Lungs: speaks full sentences without difficulty; unlabored; bilateral wheezing Extremities: no edema Skin: warm and dry Neurologic: normal gait Psychological: alert and cooperative; normal mood and affect   Reviewed Imaging from yesterday. DG Chest Portable 1 View  Result Date:  08/30/2021 CLINICAL DATA:  Shortness of breath.  Cough and fever. EXAM: PORTABLE CHEST 1 VIEW COMPARISON:  January 13, 2018. FINDINGS: The heart size and mediastinal contours are within normal limits. Both lungs are clear. No visible pleural effusions or pneumothorax. No acute osseous abnormality. IMPRESSION: No active disease. Electronically Signed   By: Feliberto Harts M.D.   On: 08/30/2021 13:51      Allergies  Allergen Reactions   Shrimp [Shellfish Allergy] Hives, Itching, Swelling and Rash    Not anaphalaxis   Penicillins    Shrimp Extract Allergy Skin Test     Past Medical History:  Diagnosis Date   Hypertension    Mixed dyslipidemia 10/15/2019   Tonsillitis    Social History   Socioeconomic History   Marital status: Single    Spouse name: Not on file   Number of children: Not on file   Years of education: Not on file   Highest education level: Not on file  Occupational History   Not on file  Tobacco Use   Smoking status: Never   Smokeless tobacco: Former  Building services engineer Use: Never used  Substance and Sexual Activity   Alcohol use: Yes    Alcohol/week: 4.0 standard drinks of alcohol    Types: 3 Shots of liquor, 1 Cans of beer per week    Comment: daily use   Drug use: No   Sexual activity: Not on file  Other Topics Concern   Not on file  Social History Narrative   Not on file   Social Determinants of Health   Financial Resource  Strain: Not on file  Food Insecurity: Not on file  Transportation Needs: Not on file  Physical Activity: Not on file  Stress: Not on file  Social Connections: Not on file  Intimate Partner Violence: Not on file   History reviewed. No pertinent family history. Past Surgical History:  Procedure Laterality Date   FRACTURE SURGERY  12/2017   right hand    I & D EXTREMITY Right 12/08/2017   Procedure: IRRIGATION AND DEBRIDEMENT EXTREMITY, RIGHT HAND;  Surgeon: Dominica Severin, MD;  Location: [facility] OR;  Service: Orthopedics;   Laterality: Right;   NO PAST SURGERIES     TONSILLECTOMY N/A 10/01/2015   Procedure: TONSILLECTOMY;  Surgeon: Drema Halon, MD;  Location: [facility];  Service: ENT;  Laterality: N/A;     Mardella Layman, MD 09/21/21 1004

## 2021-09-27 NOTE — Telephone Encounter (Signed)
No additional notes needed  

## 2021-10-19 ENCOUNTER — Telehealth: Payer: Self-pay

## 2021-10-19 NOTE — Telephone Encounter (Signed)
Pt called to schedule an appt with MD then realized his d/c instructions from ED visit on 09/01/21 said to follow up as needed. Pt offered an appt but he does not want or need one he feels at this time. He mentioned he needed refill on medication but would call his PCP for this. He did not state name of medication. No further questions/concerns at this time. I have advised him to call back if we can be of further assistance.

## 2021-11-11 ENCOUNTER — Telehealth: Payer: Self-pay | Admitting: Nurse Practitioner

## 2021-11-11 ENCOUNTER — Encounter: Payer: Self-pay | Admitting: Nurse Practitioner

## 2021-11-11 ENCOUNTER — Ambulatory Visit: Payer: BC Managed Care – PPO | Admitting: Nurse Practitioner

## 2021-11-11 VITALS — BP 142/88 | HR 85 | Temp 97.7°F | Ht 65.5 in | Wt 261.6 lb

## 2021-11-11 DIAGNOSIS — I10 Essential (primary) hypertension: Secondary | ICD-10-CM | POA: Diagnosis not present

## 2021-11-11 DIAGNOSIS — E785 Hyperlipidemia, unspecified: Secondary | ICD-10-CM

## 2021-11-11 DIAGNOSIS — R7989 Other specified abnormal findings of blood chemistry: Secondary | ICD-10-CM

## 2021-11-11 DIAGNOSIS — E349 Endocrine disorder, unspecified: Secondary | ICD-10-CM | POA: Diagnosis not present

## 2021-11-11 LAB — LIPID PANEL
Cholesterol: 155 mg/dL (ref 0–200)
HDL: 50.1 mg/dL (ref >39.00)
LDL Cholesterol: 67 mg/dL (ref 0–99)
NonHDL: 105.03
Total CHOL/HDL Ratio: 3
Triglycerides: 190 mg/dL — ABNORMAL HIGH (ref 0.0–149.0)
VLDL: 38 mg/dL (ref 0.0–40.0)

## 2021-11-11 LAB — COMPREHENSIVE METABOLIC PANEL
ALT: 34 U/L (ref 0–53)
AST: 31 U/L (ref 0–37)
Albumin: 4.3 g/dL (ref 3.5–5.2)
Alkaline Phosphatase: 41 U/L (ref 39–117)
BUN: 15 mg/dL (ref 6–23)
CO2: 28 mEq/L (ref 19–32)
Calcium: 9.5 mg/dL (ref 8.4–10.5)
Chloride: 101 mEq/L (ref 96–112)
Creatinine, Ser: 0.99 mg/dL (ref 0.40–1.50)
GFR: 97.4 mL/min (ref >60.00)
Glucose, Bld: 93 mg/dL (ref 70–99)
Potassium: 3.6 mEq/L (ref 3.5–5.1)
Sodium: 137 mEq/L (ref 135–145)
Total Bilirubin: 0.5 mg/dL (ref 0.2–1.2)
Total Protein: 7.4 g/dL (ref 6.0–8.3)

## 2021-11-11 LAB — HEMOGLOBIN A1C: Hgb A1c MFr Bld: 6.3 % (ref 4.6–6.5)

## 2021-11-11 LAB — TSH: TSH: 2.86 u[IU]/mL (ref 0.35–5.50)

## 2021-11-11 MED ORDER — TESTOSTERONE 12.5 MG/ACT (1%) TD GEL: 50.0000 mg | Freq: Every day | TRANSDERMAL | 0 refills | Status: DC

## 2021-11-11 NOTE — Assessment & Plan Note (Signed)
Does not do very much exercising.  He is interested in medication to help with his weight loss.  Pending labs today

## 2021-11-11 NOTE — Telephone Encounter (Signed)
Called patient reviewed all information and repeated back to me. Will call if any questions.  Patient informed that we will need do auth and could take several business days. I have called and cancelled old testosterone script by past provider with pharmacy.  No further action needed at this time.

## 2021-11-11 NOTE — Assessment & Plan Note (Signed)
Patient is fasting in office pending cholesterol labs.  Patient was maintained on rosuvastatin and fenofibrate.

## 2021-11-11 NOTE — Telephone Encounter (Signed)
Can we call and let the patient know that he has refills at the pharmacy on all the medications we discussed in office  The testosterone will require a PA I sent in a new script. Can we call and cancel the other script so he does not have 2 on file from the other provider please

## 2021-11-11 NOTE — Assessment & Plan Note (Signed)
Patient states he was going to be started on testosterone gel.  Prescription was sent to the pharmacy but is never able refill.  Did call does require prior authorization we will resend prescription and my name and recheck levels in 3 months.

## 2021-11-11 NOTE — Progress Notes (Signed)
New Patient Office Visit  Subjective    Patient ID: Daniel Kelley, male    DOB: 10/23/1985  Age: 36 y.o. MRN: [id number]  CC:  Chief Complaint  Patient presents with   Obesity    Was given Wellbutrin in the past and wanted to see if he could start back on that    HPI Tejay Till presents to establish care  HTN: does check his blood pressure at home approx 3 times a week on losartan and hctz. Not on almodipine as patient states that he did discuss with previous primary care provider that makes him too sleepy.  Patient also brought him an old bottle of losartan-HCTZ 100-12.5 mg.  HLD: crestor and fenofibrate.  Patient states that he has been on of the fenofibrate as he did not realize it was refilled at the pharmacy.  He was unsure why he was on 2 cholesterol medications per his report. No exercising regularly. 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  Testosterone: has been on injections in the past. Was going on gel but not approved per patient report.  Will check with pharmacy to see if he needs to be done.    Obesity: states that he was on welbutrin SL in the past and helped him lose water weight.  We did discuss that Wellbutrin is not used for weight loss we did discuss weight loss medications in regards to [facility] and Saxenda.  Patient or family without history of medullary thyroid cancer, multiple endocrine neoplasia syndrome type II.  Patient denies personal history of pancreatitis.   Outpatient Encounter Medications as of 11/11/2021  Medication Sig   hydrochlorothiazide (HYDRODIURIL) 25 MG tablet Take 1 tablet (25 mg total) by mouth daily.   losartan (COZAAR) 50 MG tablet Take 1 tablet (50 mg total) by mouth in the morning and at bedtime.   rosuvastatin (CRESTOR) 20 MG tablet Take 1 tablet (20 mg total) by mouth daily.   fenofibrate 40 MG TABS Take 1 tablet (40 mg total) by mouth daily. (Patient not taking: Reported on 11/11/2021)    sildenafil (VIAGRA) 100 MG tablet Take 0.5-1 tablets (50-100 mg total) by mouth daily as needed for erectile dysfunction. (Patient not taking: Reported on 11/11/2021)   Testosterone 12.5 MG/ACT (1%) GEL Place 50 mg onto the skin daily. Apply to bilateral shoulders and upper arms   [DISCONTINUED] amLODipine (NORVASC) 10 MG tablet Take 1 tablet (10 mg total) by mouth daily. (Patient not taking: Reported on 11/11/2021)   [DISCONTINUED] doxycycline (VIBRAMYCIN) 100 MG capsule Take 1 capsule (100 mg total) by mouth 2 (two) times daily.   [DISCONTINUED] predniSONE (DELTASONE) 20 MG tablet Take 2 tablets (40 mg total) by mouth daily.   [DISCONTINUED] promethazine-dextromethorphan (PROMETHAZINE-DM) 6.25-15 MG/5ML syrup Take 5 mLs by mouth 4 (four) times daily as needed for cough.   [DISCONTINUED] Testosterone 12.5 MG/ACT (1%) GEL Place 50 mg onto the skin daily. Apply to bilateral shoulders and upper arms (Patient not taking: Reported on 11/11/2021)   No facility-administered encounter medications on file as of 11/11/2021.    Past Medical History:  Diagnosis Date   Hyperlipidemia    Hypertension    Low testosterone    Mixed dyslipidemia 10/15/2019   Tonsillitis     Past Surgical History:  Procedure Laterality Date   FRACTURE SURGERY  12/2017   right hand    I & D EXTREMITY Right 12/08/2017   Procedure: IRRIGATION AND DEBRIDEMENT EXTREMITY, RIGHT  HAND;  Surgeon: Dominica Severin, MD;  Location: [facility] OR;  Service: Orthopedics;  Laterality: Right;   NO PAST SURGERIES     TONSILLECTOMY N/A 10/01/2015   Procedure: TONSILLECTOMY;  Surgeon: Drema Halon, MD;  Location: [facility];  Service: ENT;  Laterality: N/A;    Family History  Problem Relation Age of Onset   Hypertension Mother    Hypertension Father     Social History   Socioeconomic History   Marital status: Married    Spouse name: Not on file   Number of children: 2   Years of education: some college   Highest education  level: High school graduate  Occupational History   Not on file  Tobacco Use   Smoking status: Never   Smokeless tobacco: Never  Vaping Use   Vaping Use: Never used  Substance and Sexual Activity   Alcohol use: Yes    Alcohol/week: 4.0 standard drinks of alcohol    Types: 1 Cans of beer, 3 Shots of liquor per week    Comment: on weekends saturday  2 beers and 3 liquors daily   Drug use: No   Sexual activity: Not on file  Other Topics Concern   Not on file  Social History Narrative   Self employed      10 girl and 6 boy   Social Determinants of Corporate investment banker Strain: Not on file  Food Insecurity: Not on file  Transportation Needs: Not on file  Physical Activity: Not on file  Stress: Not on file  Social Connections: Not on file  Intimate Partner Violence: Not on file    Review of Systems  Constitutional:  Negative for chills and fever.  Respiratory:  Negative for shortness of breath.   Cardiovascular:  Negative for chest pain and leg swelling.  Gastrointestinal:  Negative for abdominal pain, blood in stool, constipation, diarrhea, nausea and vomiting.       BM daily  Genitourinary:  Negative for dysuria and hematuria.  Neurological:  Negative for tingling and headaches.  Psychiatric/Behavioral:  Negative for hallucinations and suicidal ideas.         Objective    BP (!) 142/88   Pulse 85   Temp 97.7 F (36.5 C) (Temporal)   Ht 5' 5.5" (1.664 m)   Wt 261 lb 9.6 oz (118.7 kg)   SpO2 98%   BMI 42.87 kg/m   Physical Exam Vitals and nursing note reviewed.  Constitutional:      Appearance: Normal appearance.  HENT:     Right Ear: Tympanic membrane, ear canal and external ear normal.     Left Ear: Tympanic membrane, ear canal and external ear normal.  Cardiovascular:     Rate and Rhythm: Normal rate and regular rhythm.     Pulses: Normal pulses.     Heart sounds: Normal heart sounds.  Pulmonary:     Effort: Pulmonary effort is normal.      Breath sounds: Normal breath sounds.  Abdominal:     General: Bowel sounds are normal. There is no distension.     Palpations: There is no mass.     Tenderness: There is no abdominal tenderness.     Hernia: No hernia is present.  Musculoskeletal:     Right lower leg: No edema.     Left lower leg: No edema.  Skin:    General: Skin is warm.  Neurological:     General: No focal deficit present.  Mental Status: He is alert.     Deep Tendon Reflexes:     Reflex Scores:      Bicep reflexes are 2+ on the right side and 2+ on the left side.      Patellar reflexes are 2+ on the right side and 2+ on the left side.    Comments: Bilateral upper and lower extremity strength 5/5         Assessment & Plan:   Problem List Items Addressed This Visit       Cardiovascular and Mediastinum   Essential hypertension - Primary    Patient currently maintained on losartan 50 mg, hydrochlorothiazide 25 mg.  Pending labs today      Relevant Orders   Comprehensive metabolic panel   TSH     Other   Hyperlipidemia LDL goal <100    Patient is fasting in office pending cholesterol labs.  Patient was maintained on rosuvastatin and fenofibrate.      Relevant Orders   Comprehensive metabolic panel   Lipid panel   Morbid obesity (HCC)    Does not do very much exercising.  He is interested in medication to help with his weight loss.  Pending labs today      Relevant Orders   Hemoglobin A1c   TSH   Testosterone deficiency    Patient states he was going to be started on testosterone gel.  Prescription was sent to the pharmacy but is never able refill.  Did call does require prior authorization we will resend prescription and my name and recheck levels in 3 months.      Other Visit Diagnoses     Low testosterone       Relevant Medications   Testosterone 12.5 MG/ACT (1%) GEL       Return in about 3 months (around 02/10/2022) for Recheck on chronic conditions.   Audria Nine, NP

## 2021-11-11 NOTE — Patient Instructions (Signed)
Nice to see you today I will be in touch with the labs and information in regards to your testosterone Follow up with me in 3 months

## 2021-11-11 NOTE — Assessment & Plan Note (Signed)
Patient currently maintained on losartan 50 mg, hydrochlorothiazide 25 mg.  Pending labs today

## 2021-11-14 ENCOUNTER — Telehealth: Payer: Self-pay

## 2021-11-14 NOTE — Telephone Encounter (Signed)
PA for Testosterone Gel has been submitted via covermymeds. Waiting decision. Key: [id number]

## 2021-11-15 ENCOUNTER — Telehealth: Payer: Self-pay | Admitting: Nurse Practitioner

## 2021-11-15 MED ORDER — WEGOVY 0.25 MG/0.5ML ~~LOC~~ SOAJ: 0.2500 mg | SUBCUTANEOUS | 0 refills | Status: DC

## 2021-11-15 NOTE — Telephone Encounter (Signed)
PA for [facility] submitted via covermymeds. Waiting for determination Key: [id number]

## 2021-11-15 NOTE — Telephone Encounter (Signed)
PA approved and patient advised. PA information sent to [facility] pharmacy to let them know also.

## 2021-11-15 NOTE — Telephone Encounter (Signed)
-----   Message from [facility] V, [place] sent at 11/14/2021  4:51 PM EDT ----- Patient advised. Patient is willing to try the injectable medication.

## 2021-11-15 NOTE — Telephone Encounter (Signed)
[facility] medication sent in

## 2021-11-16 ENCOUNTER — Telehealth: Payer: Self-pay

## 2021-11-16 DIAGNOSIS — I10 Essential (primary) hypertension: Secondary | ICD-10-CM

## 2021-11-16 NOTE — Telephone Encounter (Signed)
PA approved 09/14/2021-05/15/2022, patient and pharmacy advised.

## 2021-11-16 NOTE — Telephone Encounter (Signed)
He will urinate less with less diuretic. I do not think that increasing the fluid pill is going to make a large enough difference in his blood pressure.Can we verify that he is taking 1 or two tablets of the losartan 50mg . If he is taking two tablets of the 50 then that is top dose and we may need to switch agents If he is having readings consistent 140+ on the top or 90+ on the bottom

## 2021-11-16 NOTE — Telephone Encounter (Signed)
Patient states he was seen on 11/11/21 at that time he was taking Losartan-HCTZ 100-12.5 mg taking 1/2 tablet daily and HCTZ 25 mg 1 tablet once daily. Patient was told at that time not to take the combo medication but to take Losartan 50 mg and HCTZ 25 mg 1 tablet daily separately. Patient started the new regimen on 11/12/21-since then noticed b/p is more elevated when as before it was in 130s/70-80s now seen numbers in 150s/89. Has noticed he is retaining more fluid in his arms and hands and not urinating as frequently as he was when getting more diuretic in. Would like to know if he should increase dose at this time?

## 2021-11-16 NOTE — Telephone Encounter (Signed)
He is taking Losartan 50 mg 1 tablet once daily

## 2021-11-17 MED ORDER — LOSARTAN POTASSIUM 100 MG PO TABS: 100.0000 mg | ORAL_TABLET | Freq: Every day | ORAL | 0 refills | Status: DC

## 2021-11-17 NOTE — Telephone Encounter (Signed)
Medication sent in. 

## 2021-11-17 NOTE — Telephone Encounter (Signed)
If his blood pressure is running high we can increase the losartan to 100mg . I will send in an updated prescription once you discuss this with him.

## 2021-11-17 NOTE — Telephone Encounter (Signed)
Patient advised. Patient agrees to increasing Losartan to 100 mg. Please send to [facility].

## 2021-11-17 NOTE — Addendum Note (Signed)
Addended by: Eden Emms on: 11/17/2021 04:59 PM   Modules accepted: Orders

## 2021-11-21 ENCOUNTER — Ambulatory Visit: Payer: Self-pay | Admitting: Nurse Practitioner

## 2021-12-23 ENCOUNTER — Other Ambulatory Visit: Payer: Self-pay | Admitting: Nurse Practitioner

## 2021-12-23 DIAGNOSIS — R7989 Other specified abnormal findings of blood chemistry: Secondary | ICD-10-CM

## 2022-01-30 ENCOUNTER — Encounter: Payer: Self-pay | Admitting: Nurse Practitioner

## 2022-01-30 DIAGNOSIS — E782 Mixed hyperlipidemia: Secondary | ICD-10-CM

## 2022-01-30 MED ORDER — FENOFIBRATE 40 MG PO TABS: 40.0000 mg | ORAL_TABLET | Freq: Every day | ORAL | 1 refills | Status: DC

## 2022-01-30 MED ORDER — ROSUVASTATIN CALCIUM 20 MG PO TABS: 20.0000 mg | ORAL_TABLET | Freq: Every day | ORAL | 1 refills | Status: DC

## 2022-01-30 NOTE — Telephone Encounter (Signed)
Please see refill request.

## 2022-02-10 ENCOUNTER — Other Ambulatory Visit (HOSPITAL_COMMUNITY): Payer: Self-pay

## 2022-02-10 ENCOUNTER — Ambulatory Visit: Payer: BC Managed Care – PPO | Admitting: Nurse Practitioner

## 2022-02-10 ENCOUNTER — Telehealth: Payer: Self-pay

## 2022-02-10 ENCOUNTER — Encounter: Payer: Self-pay | Admitting: Nurse Practitioner

## 2022-02-10 VITALS — BP 162/96 | HR 94 | Temp 98.4°F | Ht 65.5 in | Wt 269.2 lb

## 2022-02-10 DIAGNOSIS — I10 Essential (primary) hypertension: Secondary | ICD-10-CM

## 2022-02-10 DIAGNOSIS — E349 Endocrine disorder, unspecified: Secondary | ICD-10-CM

## 2022-02-10 LAB — CBC
HCT: 39.6 % (ref 39.0–52.0)
Hemoglobin: 13.4 g/dL (ref 13.0–17.0)
MCHC: 33.7 g/dL (ref 30.0–36.0)
MCV: 90.4 fl (ref 78.0–100.0)
Platelets: 299 10*3/uL (ref 150.0–400.0)
RBC: 4.39 Mil/uL (ref 4.22–5.81)
RDW: 14.5 % (ref 11.5–15.5)
WBC: 6.8 10*3/uL (ref 4.0–10.5)

## 2022-02-10 LAB — BASIC METABOLIC PANEL
BUN: 12 mg/dL (ref 6–23)
CO2: 29 mEq/L (ref 19–32)
Calcium: 9.9 mg/dL (ref 8.4–10.5)
Chloride: 102 mEq/L (ref 96–112)
Creatinine, Ser: 0.94 mg/dL (ref 0.40–1.50)
GFR: 103.47 mL/min (ref >60.00)
Glucose, Bld: 102 mg/dL — ABNORMAL HIGH (ref 70–99)
Potassium: 3.9 mEq/L (ref 3.5–5.1)
Sodium: 139 mEq/L (ref 135–145)

## 2022-02-10 LAB — PSA: PSA: 0.91 ng/mL (ref 0.10–4.00)

## 2022-02-10 LAB — TESTOSTERONE: Testosterone: 175.73 ng/dL — ABNORMAL LOW (ref 300.00–890.00)

## 2022-02-10 MED ORDER — ZEPBOUND 2.5 MG/0.5ML ~~LOC~~ SOAJ: 2.5000 mg | SUBCUTANEOUS | 0 refills | Status: DC

## 2022-02-10 MED ORDER — VALSARTAN-HYDROCHLOROTHIAZIDE 160-25 MG PO TABS: 1.0000 | ORAL_TABLET | Freq: Every day | ORAL | 3 refills | Status: DC

## 2022-02-10 NOTE — Assessment & Plan Note (Signed)
Patient is taking his blood pressure medicine as prescribed.  States he did take it this morning blood pressure elevated x 2 in office.  Will discontinue losartan HCTZ started on valsartan HCTZ.  Follow-up in 1 month for BP recheck

## 2022-02-10 NOTE — Progress Notes (Signed)
Established Patient Office Visit  Subjective   Patient ID: Daniel Kelley, male    DOB: 09/06/1985  Age: 36 y.o. MRN: [id number]  Chief Complaint  Patient presents with   Follow-up    3 month    HPI  Low testosterone: Patient has history of low testosterone.  The previous provider they were trying to get testosterone approved but unable to.  Did place patient on testosterone gel with last office with me.  Currently maintained on testosterone 50 mg daily. States that he has noticed an increase in energy  HTN: Patient currently maintained on losartan 100 mg and hydrochlorothiazide 25 mg. States that he is checking his bp in the morning sometimes daily. States    Obesity: We did write Wegovy at last office visit. States that he does not exercise out side of work. 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 Does snore    Review of Systems  Constitutional:  Negative for chills and fever.  Respiratory:  Negative for shortness of breath.   Cardiovascular:  Negative for chest pain.  Gastrointestinal:        Bm daily   Genitourinary:  Negative for dysuria and frequency.       States that he pees 5-7 times. States mostly clear and pale yellow      Objective:     BP (!) 162/96   Pulse 94   Temp 98.4 F (36.9 C) (Oral)   Ht 5' 5.5" (1.664 m)   Wt 269 lb 3.2 oz (122.1 kg)   SpO2 98%   BMI 44.12 kg/m  BP Readings from Last 3 Encounters:  02/10/22 (!) 162/96  11/11/21 (!) 142/88  09/01/21 120/70   Wt Readings from Last 3 Encounters:  02/10/22 269 lb 3.2 oz (122.1 kg)  11/11/21 261 lb 9.6 oz (118.7 kg)  09/01/21 258 lb (117 kg)      Physical Exam Vitals and nursing note reviewed.  Constitutional:      Appearance: Normal appearance.  Cardiovascular:     Rate and Rhythm: Normal rate and regular rhythm.     Heart sounds: Normal heart sounds.  Pulmonary:     Breath sounds: Normal  breath sounds.  Abdominal:     General: Bowel sounds are normal.  Musculoskeletal:     Right lower leg: No edema.     Left lower leg: No edema.  Neurological:     Mental Status: He is alert.      No results found for any visits on 02/10/22.    The ASCVD Risk score (Arnett DK, et al., 2019) failed to calculate for the following reasons:   The 2019 ASCVD risk score is only valid for ages 20 to 60    Assessment & Plan:   Problem List Items Addressed This Visit       Cardiovascular and Mediastinum   Essential hypertension - Primary    Patient is taking his blood pressure medicine as prescribed.  States he did take it this morning blood pressure elevated x 2 in office.  Will discontinue losartan HCTZ started on valsartan HCTZ.  Follow-up in 1 month for BP recheck      Relevant Medications   valsartan-hydrochlorothiazide (DIOVAN-HCT) 160-25 MG tablet   Other Relevant Orders   CBC   Basic metabolic panel     Other   Morbid obesity (HCC)    Patient still  struggling with weight.  Unable to get [facility] weight loss medication.  Will place order for Zepbound see if this medication is available and affordable to patient.      Relevant Medications   Tirzepatide-Weight Management (ZEPBOUND) 2.5 MG/0.5ML SOAJ   Testosterone deficiency    Patient has been using testosterone gel as prescribed.  States he has noticed an increase in energy.  Will check basic labs today inclusive of testosterone level.  Pending result continue testosterone gel as prescribed      Relevant Orders   PSA   Testosterone   Prolactin    Return in about 4 weeks (around 03/10/2022) for BP recheck.    Audria Nine, NP

## 2022-02-10 NOTE — Assessment & Plan Note (Signed)
Patient has been using testosterone gel as prescribed.  States he has noticed an increase in energy.  Will check basic labs today inclusive of testosterone level.  Pending result continue testosterone gel as prescribed

## 2022-02-10 NOTE — Assessment & Plan Note (Signed)
Patient still struggling with weight.  Unable to get [facility] weight loss medication.  Will place order for Zepbound see if this medication is available and affordable to patient.

## 2022-02-10 NOTE — Patient Instructions (Signed)
Nice to see you today I have changed your blood pressure medication. STOP taking the Losartan and the HCTZ. The new medication is a combo pill. Follow up with me in 4 weeks for a blood pressure recheck, sooner if you need me

## 2022-02-10 NOTE — Telephone Encounter (Signed)
Pharmacy Patient Advocate Encounter   Received notification from [facility] that prior authorization for Zepbound 2.5mg /0.64ml is required/requested.   PA submitted on 02/10/22 to [facility]  via CoverMyMeds  Lowella Dell PA Case ID: [id number] Status is pending

## 2022-02-11 LAB — PROLACTIN: Prolactin: 8.3 ng/mL (ref 2.0–18.0)

## 2022-03-07 ENCOUNTER — Encounter: Payer: Self-pay | Admitting: Nurse Practitioner

## 2022-03-09 ENCOUNTER — Other Ambulatory Visit (HOSPITAL_COMMUNITY): Payer: Self-pay

## 2022-03-16 ENCOUNTER — Other Ambulatory Visit (HOSPITAL_COMMUNITY): Payer: Self-pay

## 2022-03-16 NOTE — Telephone Encounter (Signed)
Placed a follow up call to [facility] for prior auth.   Previous prior Josem Kaufmann was not on file. Faxed to [phone].

## 2022-03-23 ENCOUNTER — Other Ambulatory Visit (HOSPITAL_COMMUNITY): Payer: Self-pay

## 2022-03-23 NOTE — Telephone Encounter (Signed)
Patient Advocate Encounter  Prior Authorization for Zepbound 2.5mg /0.80ml has been approved.    Per test claim, refill too soon last filled 03/22/2022

## 2022-04-03 NOTE — Telephone Encounter (Signed)
Spoke to pt, he stated he'd call back to schedule ov.

## 2022-04-03 NOTE — Telephone Encounter (Signed)
Wanted him in office to recheck blood pressure

## 2022-04-03 NOTE — Telephone Encounter (Signed)
FYI

## 2022-04-03 NOTE — Telephone Encounter (Signed)
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

## 2022-04-06 ENCOUNTER — Encounter: Payer: Self-pay | Admitting: Nurse Practitioner

## 2022-04-06 ENCOUNTER — Ambulatory Visit: Payer: BC Managed Care – PPO | Admitting: Nurse Practitioner

## 2022-04-06 VITALS — BP 150/92 | HR 103 | Ht 65.5 in | Wt 269.0 lb

## 2022-04-06 DIAGNOSIS — I10 Essential (primary) hypertension: Secondary | ICD-10-CM

## 2022-04-06 DIAGNOSIS — E349 Endocrine disorder, unspecified: Secondary | ICD-10-CM | POA: Diagnosis not present

## 2022-04-06 MED ORDER — HYDROCHLOROTHIAZIDE 25 MG PO TABS: 25.0000 mg | ORAL_TABLET | Freq: Every day | ORAL | 0 refills | Status: DC

## 2022-04-06 NOTE — Assessment & Plan Note (Signed)
Discontinue valsartan-HCTZ.  Patient back on losartan 50 mg.  Will add on HCTZ 25 mg.  If blood pressure still uncontrolled at next office visit we can increase HCTZ.  He will follow-up 1 month for BP follow-up and BMP

## 2022-04-06 NOTE — Progress Notes (Signed)
Established Patient Office Visit  Subjective   Patient ID: Daniel Kelley, male    DOB: 1985-11-17  Age: 37 y.o. 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 He was switched from losartan to valsartan. 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 He stopped taking the valsartan and went back to the losartan medication and is here for a follow up   States that he stopped taking it In the morning as his "body was in a different mood" states that on his way to work he would have to stop and nap. He switched to the eveing and went to sleep when he got home. States that he tried it at night and felt hung over the next morning.   States that he does have a wrist cuff. Has been checking it every other day. States that it has been in the 130-140s    Review of Systems  Constitutional:  Negative for chills and fever.  Respiratory:  Negative for shortness of breath.   Cardiovascular:  Negative for chest pain.  Neurological:  Negative for headaches.      Objective:     BP (!) 150/92   Pulse (!) 103   Ht 5' 5.5" (1.664 m)   Wt 269 lb (122 kg)   SpO2 97%   BMI 44.08 kg/m  BP Readings from Last 3 Encounters:  04/06/22 (!) 150/92  02/10/22 (!) 162/96  11/11/21 (!) 142/88   Wt Readings from Last 3 Encounters:  04/06/22 269 lb (122 kg)  02/10/22 269 lb 3.2 oz (122.1 kg)  11/11/21 261 lb 9.6 oz (118.7 kg)      Physical Exam Vitals and nursing note reviewed.  Constitutional:      Appearance: Normal appearance.  Cardiovascular:     Rate and Rhythm: Normal rate and regular rhythm.     Heart sounds: Normal heart sounds.  Pulmonary:     Effort: Pulmonary effort is normal.     Breath sounds: Normal breath  sounds.  Neurological:     Mental Status: He is alert.      No results found for any visits on 04/06/22.    The ASCVD Risk score (Arnett DK, et al., 2019) failed to calculate for the following reasons:   The 2019 ASCVD risk score is only valid for ages 15 to 67    Assessment & Plan:   Problem List Items Addressed This Visit       Cardiovascular and Mediastinum   Essential hypertension - Primary    Discontinue valsartan-HCTZ.  Patient back on losartan 50 mg.  Will add on HCTZ 25 mg.  If blood pressure still uncontrolled at next office visit we can increase HCTZ.  He will follow-up 1 month for BP follow-up and BMP      Relevant Medications   losartan (COZAAR) 50 MG tablet   hydrochlorothiazide (HYDRODIURIL) 25 MG tablet     Other   Testosterone deficiency    Patient was requesting increase in testosterone.  I did have note to increase TRT to 75 mg daily after getting blood pressure under control as this just increases his cardiovascular risk.  Patient has not gotten  testosterone filled since October.  States he still have some at home query adherence       Return in about 4 weeks (around 05/04/2022) for BP recheck.    Romilda Garret, NP

## 2022-04-06 NOTE — Patient Instructions (Signed)
Take the losartan 50mg  tablet I am adding on the hydrochlorothiazide (HCTZ) 25mg . You will take both Follow up with me in 1 month for a recheck

## 2022-04-06 NOTE — Assessment & Plan Note (Signed)
Patient was requesting increase in testosterone.  I did have note to increase TRT to 75 mg daily after getting blood pressure under control as this just increases his cardiovascular risk.  Patient has not gotten testosterone filled since October.  States he still have some at home query adherence

## 2022-04-26 ENCOUNTER — Other Ambulatory Visit: Payer: Self-pay | Admitting: Nurse Practitioner

## 2022-04-26 ENCOUNTER — Encounter: Payer: Self-pay | Admitting: Nurse Practitioner

## 2022-04-26 DIAGNOSIS — R7989 Other specified abnormal findings of blood chemistry: Secondary | ICD-10-CM

## 2022-04-26 MED ORDER — TESTOSTERONE 12.5 MG/ACT (1%) TD GEL: 50.0000 mg | Freq: Every day | TRANSDERMAL | 0 refills | Status: DC

## 2022-05-30 ENCOUNTER — Telehealth: Payer: Self-pay | Admitting: Nurse Practitioner

## 2022-05-30 ENCOUNTER — Encounter: Payer: Self-pay | Admitting: Nurse Practitioner

## 2022-05-30 ENCOUNTER — Ambulatory Visit: Payer: BC Managed Care – PPO | Admitting: Nurse Practitioner

## 2022-05-30 DIAGNOSIS — I10 Essential (primary) hypertension: Secondary | ICD-10-CM | POA: Diagnosis not present

## 2022-05-30 DIAGNOSIS — R7989 Other specified abnormal findings of blood chemistry: Secondary | ICD-10-CM | POA: Diagnosis not present

## 2022-05-30 LAB — BASIC METABOLIC PANEL
BUN: 13 mg/dL (ref 6–23)
CO2: 29 mEq/L (ref 19–32)
Calcium: 9.9 mg/dL (ref 8.4–10.5)
Chloride: 101 mEq/L (ref 96–112)
Creatinine, Ser: 1.13 mg/dL (ref 0.40–1.50)
GFR: 82.79 mL/min (ref >60.00)
Glucose, Bld: 99 mg/dL (ref 70–99)
Potassium: 3.8 mEq/L (ref 3.5–5.1)
Sodium: 138 mEq/L (ref 135–145)

## 2022-05-30 MED ORDER — TESTOSTERONE 12.5 MG/ACT (1%) TD GEL: 50.0000 mg | Freq: Every day | TRANSDERMAL | 0 refills | Status: DC

## 2022-05-30 MED ORDER — HYDROCHLOROTHIAZIDE 25 MG PO TABS: 25.0000 mg | ORAL_TABLET | Freq: Every day | ORAL | 1 refills | Status: DC

## 2022-05-30 MED ORDER — LOSARTAN POTASSIUM 50 MG PO TABS: 50.0000 mg | ORAL_TABLET | Freq: Every day | ORAL | 1 refills | Status: DC

## 2022-05-30 NOTE — Progress Notes (Signed)
   Established Patient Office Visit  Subjective   Patient ID: Daniel Kelley, male    DOB: [birth year]  Age: 37 y.o. MRN: [id number]  Chief Complaint  Patient presents with   Hypertension       HTN: states that he has been taking the medications as prescribed.  Patient currently on losartan 50 mg and hydrochlorothiazide 25 mg daily.  Patient has been checking his blood pressure at home and has brought in readings of once weekly blood pressure checks.  Patient does have a wrist blood pressure cuff.  TRT: states that he is doing 50mg  a day and some days he forgets to use it.  Per patient's PDMP he has been written for 30-day supplies but patient states it has been lasting longer and that this has been getting several bottles of the medication.   Review of Systems  Constitutional:  Negative for chills and fever.  Respiratory:  Negative for shortness of breath.   Cardiovascular:  Negative for chest pain.  Neurological:  Negative for dizziness and headaches.      Objective:     BP 120/88   Pulse 88   Temp 99.2 F (37.3 C)   Resp 16   Ht 5' 5.5" (1.664 m)   Wt 271 lb (122.9 kg)   SpO2 98%   BMI 44.41 kg/m    Physical Exam Vitals and nursing note reviewed.  Constitutional:      Appearance: Normal appearance.  Cardiovascular:     Rate and Rhythm: Normal rate and regular rhythm.     Heart sounds: Normal heart sounds.  Pulmonary:     Effort: Pulmonary effort is normal.     Breath sounds: Normal breath sounds.  Musculoskeletal:     Right lower leg: No edema.     Left lower leg: No edema.  Neurological:     Mental Status: He is alert.      No results found for any visits on 05/30/22.    The ASCVD Risk score (Arnett DK, et al., 2019) failed to calculate for the following reasons:   The 2019 ASCVD risk score is only valid for ages 38 to 6    Assessment & Plan:   Problem List Items Addressed This Visit       Cardiovascular and Mediastinum   Essential  hypertension    Patient currently maintained on losartan 50 mg daily and hydrochlorothiazide 25 mg daily.  Patient taking medication as prescribed tolerating them well.  Blood pressure within normal limits.  Continue medication as prescribed refills provided today      Relevant Medications   hydrochlorothiazide (HYDRODIURIL) 25 MG tablet   losartan (COZAAR) 50 MG tablet   Other Relevant Orders   Basic metabolic panel     Other   Low testosterone    Patient currently maintained on topical testosterone gel.  He is doing 50 mg daily.  Per his report has been getting enough medication last several months but according to what the prescription and PDMP says it should last him for 30 days query adherence to medication.  Refill provided today will reach out to pharmacy to verify dispensing and dosing.      Relevant Medications   Testosterone 12.5 MG/ACT (1%) GEL    Return in about 4 months (around 09/29/2022) for BP recheck/ TRT.    Romilda Garret, NP

## 2022-05-30 NOTE — Telephone Encounter (Signed)
Can we call the pharmacy where the patient is getting the testosterone filled out and find out how much they are dispensing and how long it should last for the patient. He is mentioning he his getting a couple bottles that is lasting him longer than what I feel like the prescription should be

## 2022-05-30 NOTE — Assessment & Plan Note (Signed)
Patient currently maintained on losartan 50 mg daily and hydrochlorothiazide 25 mg daily.  Patient taking medication as prescribed tolerating them well.  Blood pressure within normal limits.  Continue medication as prescribed refills provided today

## 2022-05-30 NOTE — Assessment & Plan Note (Signed)
Patient currently maintained on topical testosterone gel.  He is doing 50 mg daily.  Per his report has been getting enough medication last several months but according to what the prescription and PDMP says it should last him for 30 days query adherence to medication.  Refill provided today will reach out to pharmacy to verify dispensing and dosing.

## 2022-05-30 NOTE — Telephone Encounter (Signed)
Called pharmacy and they stated that he gets 2 bottles filled and it should last him a month.

## 2022-05-30 NOTE — Patient Instructions (Signed)
Nice to see you today Follow up with me in 4 months, sooner if you need me

## 2022-05-31 NOTE — Telephone Encounter (Signed)
Can we inform the patient of that. If he is unsure if he is using it correctly he can check with the pharmacy

## 2022-05-31 NOTE — Telephone Encounter (Signed)
Called and it said that my call could not be made at this time, and to call back later.

## 2022-06-06 NOTE — Telephone Encounter (Signed)
No answer no voice mail  

## 2022-06-14 NOTE — Telephone Encounter (Signed)
Ok to send letter

## 2022-06-14 NOTE — Telephone Encounter (Signed)
3rd attempt to call pt no vm to leave message. Ok to send letter out?

## 2022-06-14 NOTE — Telephone Encounter (Signed)
Letter sent to pt

## 2022-08-03 ENCOUNTER — Other Ambulatory Visit: Payer: Self-pay | Admitting: Nurse Practitioner

## 2022-08-03 ENCOUNTER — Encounter: Payer: Self-pay | Admitting: Nurse Practitioner

## 2022-08-03 DIAGNOSIS — E782 Mixed hyperlipidemia: Secondary | ICD-10-CM

## 2022-08-03 DIAGNOSIS — R7989 Other specified abnormal findings of blood chemistry: Secondary | ICD-10-CM

## 2022-08-03 MED ORDER — ROSUVASTATIN CALCIUM 20 MG PO TABS: 20.0000 mg | ORAL_TABLET | Freq: Every day | ORAL | 1 refills | Status: DC

## 2022-08-03 MED ORDER — FENOFIBRATE 40 MG PO TABS: 40.0000 mg | ORAL_TABLET | Freq: Every day | ORAL | 1 refills | Status: DC

## 2022-08-03 MED ORDER — TESTOSTERONE 12.5 MG/ACT (1%) TD GEL: 50.0000 mg | Freq: Every day | TRANSDERMAL | 0 refills | Status: DC

## 2022-08-03 NOTE — Telephone Encounter (Signed)
Sent RX request to provider

## 2022-08-03 NOTE — Telephone Encounter (Signed)
LOV: 05/30/2022

## 2022-09-18 ENCOUNTER — Telehealth: Payer: Self-pay

## 2022-09-18 ENCOUNTER — Other Ambulatory Visit (HOSPITAL_COMMUNITY): Payer: Self-pay

## 2022-09-18 DIAGNOSIS — E782 Mixed hyperlipidemia: Secondary | ICD-10-CM

## 2022-09-18 NOTE — Telephone Encounter (Signed)
*  Primary  PA request received for Fenofibrate 40MG  tablets  PA submitted to [facility] via CMM and is pending additional questions/determination  Key: [id number]

## 2022-09-22 NOTE — Telephone Encounter (Signed)
PA has been DENIED, no additional information provided at this time.  

## 2022-09-25 NOTE — Telephone Encounter (Signed)
Pharmacy Patient Advocate Encounter  Received notification from  Clinical Services Team  that Prior Authorization for Fenofibrate 40mg  tabs has been DENIED because see below.   PA #/Case ID/Reference #: [id number]   Please be advised we currently do not have a Pharmacist to review denials, therefore you will need to process appeals accordingly as needed. Thanks for your support at this time. Contact for appeals are as follows: Phone: [phone], Fax: [phone]

## 2022-09-25 NOTE — Telephone Encounter (Signed)
Can we call the patient and let him know that the insurance has denied the fenofibrate. Can we make sure that he is still taking it. Also can we see if he has tried other preparations of fenofibrate in the past

## 2022-09-26 NOTE — Telephone Encounter (Signed)
Called patient per message. Pt hung up after I attempted to verify patients name. Will call back at a later time.

## 2022-09-28 MED ORDER — FENOFIBRATE 48 MG PO TABS: 48.0000 mg | ORAL_TABLET | Freq: Every day | ORAL | 1 refills | Status: DC

## 2022-09-28 NOTE — Addendum Note (Signed)
Addended by: Eden Emms on: 09/28/2022 04:32 PM   Modules accepted: Orders

## 2022-09-28 NOTE — Telephone Encounter (Signed)
Called patient call can not be completed at this time. Will send my chart to call office.

## 2022-09-28 NOTE — Telephone Encounter (Signed)
Patient called back states he has never taken anything other than the fenofibrate. States he has been out for about 2 weeks.

## 2022-09-28 NOTE — Telephone Encounter (Signed)
Changed to fenofibrate 48 sent in. This should be covered

## 2022-09-29 NOTE — Telephone Encounter (Signed)
Call cannot be completed at this time. Will call later.

## 2022-10-02 NOTE — Telephone Encounter (Signed)
Call would not go through at this time. Will send my chart to call back .

## 2022-10-13 NOTE — Telephone Encounter (Signed)
Have called patient and sent my chart no call back. I have tried to reach pharmacy but could not talk to anyone only leave message. Wanted to verify if patient had picked up meds. Do you want [place] to send letter or continue to call?

## 2022-10-13 NOTE — Telephone Encounter (Signed)
A letter is fine.

## 2022-11-16 ENCOUNTER — Other Ambulatory Visit: Payer: Self-pay

## 2022-11-16 ENCOUNTER — Encounter (HOSPITAL_BASED_OUTPATIENT_CLINIC_OR_DEPARTMENT_OTHER): Payer: Self-pay | Admitting: Pediatrics

## 2022-11-16 ENCOUNTER — Emergency Department (HOSPITAL_BASED_OUTPATIENT_CLINIC_OR_DEPARTMENT_OTHER)
Admission: EM | Admit: 2022-11-16 | Discharge: 2022-11-16 | Disposition: A | Discharge status: Home / Self Care | Payer: BC Managed Care – PPO | Attending: Emergency Medicine | Admitting: Emergency Medicine

## 2022-11-16 ENCOUNTER — Emergency Department (HOSPITAL_BASED_OUTPATIENT_CLINIC_OR_DEPARTMENT_OTHER): Payer: BC Managed Care – PPO

## 2022-11-16 DIAGNOSIS — Z20822 Contact with and (suspected) exposure to covid-19: Secondary | ICD-10-CM | POA: Diagnosis not present

## 2022-11-16 DIAGNOSIS — B9789 Other viral agents as the cause of diseases classified elsewhere: Secondary | ICD-10-CM | POA: Diagnosis not present

## 2022-11-16 DIAGNOSIS — R0981 Nasal congestion: Secondary | ICD-10-CM | POA: Diagnosis not present

## 2022-11-16 DIAGNOSIS — J069 Acute upper respiratory infection, unspecified: Secondary | ICD-10-CM | POA: Diagnosis not present

## 2022-11-16 DIAGNOSIS — R062 Wheezing: Secondary | ICD-10-CM | POA: Diagnosis not present

## 2022-11-16 DIAGNOSIS — R059 Cough, unspecified: Secondary | ICD-10-CM | POA: Diagnosis not present

## 2022-11-16 LAB — RESP PANEL BY RT-PCR (RSV, FLU A&B, COVID)  RVPGX2
Influenza A by PCR: NEGATIVE
Influenza B by PCR: NEGATIVE
Resp Syncytial Virus by PCR: NEGATIVE
SARS Coronavirus 2 by RT PCR: NEGATIVE

## 2022-11-16 MED ORDER — ALBUTEROL SULFATE HFA 108 (90 BASE) MCG/ACT IN AERS
2.0000 | INHALATION_SPRAY | RESPIRATORY_TRACT | Status: DC | PRN
  Administered 2022-11-16: 2 via RESPIRATORY_TRACT
  Filled 2022-11-16: qty 6.7

## 2022-11-16 NOTE — ED Provider Notes (Signed)
[facility] Provider Note   CSN: [id number] Arrival date & time: 11/16/22  1332     History Chief Complaint  Patient presents with   Wheezing    Daniel Kelley is a 37 y.o. male patient who presents to the emergency department today for further evaluation of general malaise, nasal congestion, and productive cough that is been ongoing for the last several days.  Patient does endorse sick contacts with his father who was visiting at his house earlier in the week.  He denies any fever, sore throat, nausea, vomiting, diarrhea.   Wheezing      Home Medications Prior to Admission medications   Medication Sig Start Date End Date Taking? Authorizing Provider  fenofibrate (TRICOR) 48 MG tablet Take 1 tablet (48 mg total) by mouth daily. 09/28/22   Eden Emms, NP  hydrochlorothiazide (HYDRODIURIL) 25 MG tablet Take 1 tablet (25 mg total) by mouth daily. 05/30/22   Eden Emms, NP  losartan (COZAAR) 50 MG tablet Take 1 tablet (50 mg total) by mouth daily. 05/30/22   Eden Emms, NP  rosuvastatin (CRESTOR) 20 MG tablet Take 1 tablet (20 mg total) by mouth daily. 08/03/22   Eden Emms, NP  sildenafil (VIAGRA) 100 MG tablet Take 0.5-1 tablets (50-100 mg total) by mouth daily as needed for erectile dysfunction. Patient not taking: Reported on 05/30/2022 08/30/20   Barbette Merino, NP  Testosterone 12.5 MG/ACT (1%) GEL Place 50 mg onto the skin daily. Apply to bilateral shoulders and upper arms 08/03/22 11/01/22  Eden Emms, NP      Allergies    Shrimp [shellfish allergy], Amoxicillin, Penicillins, and Shrimp extract    Review of Systems   Review of Systems  Respiratory:  Positive for wheezing.   All other systems reviewed and are negative.   Physical Exam Updated Vital Signs BP (!) 133/96   Pulse 65   Temp 98.1 F (36.7 C) (Oral)   Resp 15   Ht 5\' 6"  (1.676 m)   Wt 127 kg   SpO2 97%   BMI 45.19 kg/m  Physical Exam Vitals  and nursing note reviewed.  Constitutional:      General: He is not in acute distress.    Appearance: Normal appearance.  HENT:     Head: Normocephalic and atraumatic.  Eyes:     General:        Right eye: No discharge.        Left eye: No discharge.  Cardiovascular:     Comments: Regular rate and rhythm.  S1/S2 are distinct without any evidence of murmur, rubs, or gallops.  Radial pulses are 2+ bilaterally.  Dorsalis pedis pulses are 2+ bilaterally.  No evidence of pedal edema. Pulmonary:     Comments: Clear to auscultation bilaterally.  Normal effort.  No respiratory distress.  No evidence of wheezes, rales, or rhonchi heard throughout. Abdominal:     General: Abdomen is flat. Bowel sounds are normal. There is no distension.     Tenderness: There is no abdominal tenderness. There is no guarding or rebound.  Musculoskeletal:        General: Normal range of motion.     Cervical back: Neck supple.  Skin:    General: Skin is warm and dry.     Findings: No rash.  Neurological:     General: No focal deficit present.     Mental Status: He is alert.  Psychiatric:  Mood and Affect: Mood normal.        Behavior: Behavior normal.     ED Results / Procedures / Treatments   Labs (all labs ordered are listed, but only abnormal results are displayed) Labs Reviewed  RESP PANEL BY RT-PCR (RSV, FLU A&B, COVID)  [id number]    EKG None  Radiology DG Chest 2 View  Result Date: 11/16/2022 CLINICAL DATA:  Cough, wheezing. EXAM: CHEST - 2 VIEW COMPARISON:  September 01, 2021. FINDINGS: The heart size and mediastinal contours are within normal limits. Both lungs are clear. The visualized skeletal structures are unremarkable. IMPRESSION: No active cardiopulmonary disease. Electronically Signed   By: Lupita Raider M.D.   On: 11/16/2022 15:32    Procedures Procedures    Medications Ordered in ED Medications  albuterol (VENTOLIN HFA) 108 (90 Base) MCG/ACT inhaler 2 puff (2 puffs Inhalation  Given 11/16/22 1404)    ED Course/ Medical Decision Making/ A&P Clinical Course as of 11/16/22 1836  Thu Nov 16, 2022  1834 Resp panel by RT-PCR (RSV, Flu A&B, Covid) Anterior Nasal Swab Negative.  [CF]    Clinical Course User Index [CF] Teressa Lower, PA-C   {   Click here for ABCD2, HEART and other calculators  Medical Decision Making Daniel Kelley is a 37 y.o. male patient who presents to the emergency department today for further evaluation of general malaise, productive cough, and nasal congestion.  I will plan to swab him for COVID, flu, and RSV.  In addition, chest x-ray was obtained in triage interpreted by myself.  There is no evidence of pneumonia.  This likely a viral process.  Patient is in no acute distress resting comfortably watching TV in the ER.  Chest x-ray was normal.  Respiratory panel was negative.  I suspect this is likely some viral URI.  Patient is nontoxic-appearing in no acute distress.  No evidence of pneumonia.  Will treat conservatively with over-the-counter analgesics and cough medicine.  Amount and/or Complexity of Data Reviewed Labs:  Decision-making details documented in ED Course. Radiology: ordered.  Risk Prescription drug management.    Final Clinical Impression(s) / ED Diagnoses Final diagnoses:  Viral URI    Rx / DC Orders ED Discharge Orders     None         Teressa Lower, PA-C 11/16/22 1836    Sloan Leiter, DO 11/18/22 0003

## 2022-11-16 NOTE — ED Triage Notes (Signed)
C/O wheezing x1 week and feeling "sluggish"

## 2022-11-16 NOTE — ED Notes (Signed)
   11/16/22 1337  Therapy Vitals  Pulse Rate 89  Resp 19  Respiratory Assessment  $ RT Protocol Assessment  Yes  Assessment Type Assess only  Respiratory Pattern Regular;Unlabored;Symmetrical  Chest Assessment Chest expansion symmetrical  Bilateral Breath Sounds Clear;Diminished  Oxygen Therapy/Pulse Ox  O2 Therapy Room air  SpO2 98 %   Seen before triage, no increased WOB noted, BBS clear decreased, no hx asthma.

## 2022-11-16 NOTE — Discharge Instructions (Signed)
Please drink plenty of fluids and get plenty of rest.  You can take over-the-counter Mucinex for your cough and mucus.  If you have high blood pressure I would recommend taking Coricidin which is sent to the same thing but made for people with high blood pressure.  Please follow-up with your primary care doctor for further evaluation.  You may return to the emergency department for any worsening symptoms.

## 2023-05-29 ENCOUNTER — Encounter: Payer: Self-pay | Admitting: Nurse Practitioner

## 2023-05-29 ENCOUNTER — Ambulatory Visit: Admitting: Nurse Practitioner

## 2023-05-29 VITALS — BP 132/100 | HR 83 | Temp 98.2°F | Ht 65.5 in | Wt 280.8 lb

## 2023-05-29 DIAGNOSIS — R7989 Other specified abnormal findings of blood chemistry: Secondary | ICD-10-CM

## 2023-05-29 DIAGNOSIS — Z6841 Body Mass Index (BMI) 40.0 and over, adult: Secondary | ICD-10-CM | POA: Diagnosis not present

## 2023-05-29 DIAGNOSIS — E782 Mixed hyperlipidemia: Secondary | ICD-10-CM

## 2023-05-29 DIAGNOSIS — I10 Essential (primary) hypertension: Secondary | ICD-10-CM

## 2023-05-29 DIAGNOSIS — R7303 Prediabetes: Secondary | ICD-10-CM | POA: Diagnosis not present

## 2023-05-29 DIAGNOSIS — Z0000 Encounter for general adult medical examination without abnormal findings: Secondary | ICD-10-CM | POA: Insufficient documentation

## 2023-05-29 LAB — HEMOGLOBIN A1C: Hgb A1c MFr Bld: 6.4 % (ref 4.6–6.5)

## 2023-05-29 LAB — CBC
HCT: 40.7 % (ref 39.0–52.0)
Hemoglobin: 13.4 g/dL (ref 13.0–17.0)
MCHC: 32.9 g/dL (ref 30.0–36.0)
MCV: 89.3 fl (ref 78.0–100.0)
Platelets: 356 10*3/uL (ref 150.0–400.0)
RBC: 4.56 Mil/uL (ref 4.22–5.81)
RDW: 14.1 % (ref 11.5–15.5)
WBC: 7.4 10*3/uL (ref 4.0–10.5)

## 2023-05-29 LAB — LIPID PANEL
Cholesterol: 246 mg/dL — ABNORMAL HIGH (ref 0–200)
HDL: 44 mg/dL (ref >39.00)
LDL Cholesterol: 157 mg/dL — ABNORMAL HIGH (ref 0–99)
NonHDL: 202.25
Total CHOL/HDL Ratio: 6
Triglycerides: 224 mg/dL — ABNORMAL HIGH (ref 0.0–149.0)
VLDL: 44.8 mg/dL — ABNORMAL HIGH (ref 0.0–40.0)

## 2023-05-29 LAB — COMPREHENSIVE METABOLIC PANEL
ALT: 19 U/L (ref 0–53)
AST: 26 U/L (ref 0–37)
Albumin: 4.8 g/dL (ref 3.5–5.2)
Alkaline Phosphatase: 69 U/L (ref 39–117)
BUN: 14 mg/dL (ref 6–23)
CO2: 28 meq/L (ref 19–32)
Calcium: 9.9 mg/dL (ref 8.4–10.5)
Chloride: 100 meq/L (ref 96–112)
Creatinine, Ser: 0.96 mg/dL (ref 0.40–1.50)
GFR: 99.98 mL/min (ref >60.00)
Glucose, Bld: 103 mg/dL — ABNORMAL HIGH (ref 70–99)
Potassium: 4 meq/L (ref 3.5–5.1)
Sodium: 137 meq/L (ref 135–145)
Total Bilirubin: 0.4 mg/dL (ref 0.2–1.2)
Total Protein: 7.6 g/dL (ref 6.0–8.3)

## 2023-05-29 LAB — TSH: TSH: 3.09 u[IU]/mL (ref 0.35–5.50)

## 2023-05-29 MED ORDER — HYDROCHLOROTHIAZIDE 25 MG PO TABS: 25.0000 mg | ORAL_TABLET | Freq: Every day | ORAL | 1 refills | Status: DC

## 2023-05-29 MED ORDER — LOSARTAN POTASSIUM 50 MG PO TABS
50.0000 mg | ORAL_TABLET | Freq: Every day | ORAL | 1 refills | Status: DC
Start: 2023-05-29 — End: 2023-12-06

## 2023-05-29 NOTE — Assessment & Plan Note (Signed)
 History of the same with adherence and follow-up issues.  Patient would like to get back on testosterone.  I will defer this to urology ambulatory referral to urology today for testosterone management

## 2023-05-29 NOTE — Assessment & Plan Note (Signed)
 History of same.  Patient was supposed to be on fenofibrate and Crestor but nothing out of medication.  Pending lipid panel today

## 2023-05-29 NOTE — Assessment & Plan Note (Signed)
 History of same pending A1c.  Patient interested in GLP-1 receptor agonist for weight management

## 2023-05-29 NOTE — Assessment & Plan Note (Signed)
 Patient is currently maintained on losartan 50 mg daily and hydrochlorothiazide 25 mg daily.  He has not been taking medication as prescribed.  Did encourage patient take medication every day for best efficacy.  Refills of medication provided today.  Continue to check blood pressure at home

## 2023-05-29 NOTE — Assessment & Plan Note (Signed)
 History of same.  Pending TSH, A1c, lipid panel.  Patient continue working on lifestyle modifications.  Patient is interested in GLP-1 receptor agonist.  Wrote down Zepbound and [employer] patient will check with insurance which is covered polyps covered we will start patient on medication.

## 2023-05-29 NOTE — Progress Notes (Signed)
 Established Patient Office Visit  Subjective   Patient ID: Daniel Kelley, male    DOB: [birth year]  Age: 38 y.o. MRN: [id number]  Chief Complaint  Patient presents with   Annual Exam    Physical and lab work. Medication refill for testosterone.     HPI  HTN: patient is currently on losartan 50 and hydrochlorothiazide  25. He can check his blood pressure at home. He will check it intermittently. He has been getting readings of 140/89 sometimes.  Patient states currently blood pressure is due to fluid retention.  He is taking his blood pressure medications on a as needed basis  Hypogonadism: patient was suppose to be on T-gel last refill was 07/2022. States that he has been off of it for several months.   HLD: currently on fenofibrate and Crestor. States that if he takes them they make him sleepy and he cannot drive.  The patient has been taking his medications  ED: on Viagra 100mg  prn but he never picked up medication he tried it  for complete physical and follow up of chronic conditions.  Immunizations: -Tetanus: Completed in 2019 -Influenza: refused  -Shingles: too young  -Pneumonia: too young   Diet: Fair diet. He is eating 2 meals a day and no snacks. He will do coffee in the mornings. He will drink water with crystal light packs Exercise: No regular exercise. Drives a truck all day   Eye exam: PRN   Dental exam: Needs updating  Colonoscopy: too young currently average risk  Lung Cancer Screening: N/A  PSA: too young currently average risk   Sleep: states that he drives late and then will sleep in . State that he may drive 1-2 in them. Does feel rested but when he gets home he feels tired.        Review of Systems  Constitutional:  Negative for chills and fever.  Respiratory:  Negative for shortness of breath.   Cardiovascular:  Negative for chest pain and leg swelling.  Gastrointestinal:  Negative for abdominal pain, blood in stool, constipation, diarrhea, nausea  and vomiting.       Dm daily   Genitourinary:  Negative for dysuria and hematuria.  Neurological:  Negative for tingling and headaches.  Psychiatric/Behavioral:  Negative for hallucinations and suicidal ideas.       Objective:     BP (!) 132/100   Pulse 83   Temp 98.2 F (36.8 C) (Oral)   Ht 5' 5.5" (1.664 m)   Wt 280 lb 12.8 oz (127.4 kg)   SpO2 95%   BMI 46.02 kg/m  BP Readings from Last 3 Encounters:  05/29/23 (!) 132/100  11/16/22 (!) 133/96  05/30/22 120/88   Wt Readings from Last 3 Encounters:  05/29/23 280 lb 12.8 oz (127.4 kg)  11/16/22 280 lb (127 kg)  05/30/22 271 lb (122.9 kg)   SpO2 Readings from Last 3 Encounters:  05/29/23 95%  11/16/22 97%  05/30/22 98%      Physical Exam Vitals and nursing note reviewed.  Constitutional:      Appearance: Normal appearance.  HENT:     Right Ear: Tympanic membrane, ear canal and external ear normal.     Left Ear: Tympanic membrane, ear canal and external ear normal.     Mouth/Throat:     Mouth: Mucous membranes are moist.     Pharynx: Oropharynx is clear.  Eyes:     Extraocular Movements: Extraocular movements intact.     Pupils: Pupils are equal,  round, and reactive to light.  Cardiovascular:     Rate and Rhythm: Normal rate and regular rhythm.     Pulses: Normal pulses.     Heart sounds: Normal heart sounds.  Pulmonary:     Effort: Pulmonary effort is normal.     Breath sounds: Normal breath sounds.  Abdominal:     General: Bowel sounds are normal. There is no distension.     Palpations: There is no mass.     Tenderness: There is no abdominal tenderness.     Hernia: No hernia is present.  Musculoskeletal:     Right lower leg: No edema.     Left lower leg: No edema.  Lymphadenopathy:     Cervical: No cervical adenopathy.  Skin:    General: Skin is warm.  Neurological:     General: No focal deficit present.     Mental Status: He is alert.     Deep Tendon Reflexes:     Reflex Scores:      Bicep  reflexes are 2+ on the right side and 2+ on the left side.      Patellar reflexes are 2+ on the right side and 2+ on the left side.    Comments: Bilateral upper and lower extremity strength 5/5  Psychiatric:        Mood and Affect: Mood normal.        Behavior: Behavior normal.        Thought Content: Thought content normal.        Judgment: Judgment normal.      No results found for any visits on 05/29/23.    The ASCVD Risk score (Arnett DK, et al., 2019) failed to calculate for the following reasons:   The 2019 ASCVD risk score is only valid for ages 92 to 34    Assessment & Plan:   Problem List Items Addressed This Visit       Cardiovascular and Mediastinum   Essential hypertension   Patient is currently maintained on losartan 50 mg daily and hydrochlorothiazide 25 mg daily.  He has not been taking medication as prescribed.  Did encourage patient take medication every day for best efficacy.  Refills of medication provided today.  Continue to check blood pressure at home      Relevant Medications   hydrochlorothiazide (HYDRODIURIL) 25 MG tablet   losartan (COZAAR) 50 MG tablet   Other Relevant Orders   CBC   Comprehensive metabolic panel   TSH   Lipid panel     Other   Mixed dyslipidemia   History of same.  Patient was supposed to be on fenofibrate and Crestor but nothing out of medication.  Pending lipid panel today      Relevant Orders   Hemoglobin A1c   Lipid panel   Morbid obesity (HCC)   History of same.  Pending TSH, A1c, lipid panel.  Patient continue working on lifestyle modifications.  Patient is interested in GLP-1 receptor agonist.  Wrote down Zepbound and [employer] patient will check with insurance which is covered polyps covered we will start patient on medication.      Relevant Orders   Hemoglobin A1c   TSH   Lipid panel   Low testosterone   History of the same with adherence and follow-up issues.  Patient would like to get back on testosterone.  I  will defer this to urology ambulatory referral to urology today for testosterone management      Relevant Orders  CBC   Comprehensive metabolic panel   Ambulatory referral to Urology   Preventative health care - Primary   Discussed immunizations and screening exams.  Did review Patient's personal, surgical, social, family history.  Patient up-to-date on all age-appropriate vaccinations he would like.  Patient is too young for CRC screening or prostate cancer screening.  Patient will be placed at discharge about preventative healthcare maintenance with anticipatory guidance.      Prediabetes   History of same pending A1c.  Patient interested in GLP-1 receptor agonist for weight management      Relevant Orders   Hemoglobin A1c    Return in about 3 months (around 08/29/2023) for BP recheck/ weight recheck .    Audria Nine, NP

## 2023-05-29 NOTE — Patient Instructions (Addendum)
 Nice to see you today I will be in touch with the labs once I have them Follow up with me in 3 months, sooner if you need me    Zepbound and wegovy are the two weight loss injections. Call your insurance and see if either are covered

## 2023-05-29 NOTE — Assessment & Plan Note (Signed)
 Discussed immunizations and screening exams.  Did review Patient's personal, surgical, social, family history.  Patient up-to-date on all age-appropriate vaccinations he would like.  Patient is too young for CRC screening or prostate cancer screening.  Patient will be placed at discharge about preventative healthcare maintenance with anticipatory guidance.

## 2023-05-30 ENCOUNTER — Encounter: Payer: Self-pay | Admitting: Nurse Practitioner

## 2023-05-30 ENCOUNTER — Other Ambulatory Visit: Payer: Self-pay | Admitting: Nurse Practitioner

## 2023-05-30 DIAGNOSIS — E782 Mixed hyperlipidemia: Secondary | ICD-10-CM

## 2023-05-30 MED ORDER — ROSUVASTATIN CALCIUM 20 MG PO TABS
20.0000 mg | ORAL_TABLET | Freq: Every day | ORAL | 1 refills | Status: DC
Start: 2023-05-30 — End: 2023-11-08

## 2023-06-25 ENCOUNTER — Ambulatory Visit: Admitting: Nurse Practitioner

## 2023-06-26 ENCOUNTER — Ambulatory Visit: Admitting: Urology

## 2023-08-07 ENCOUNTER — Ambulatory Visit: Admitting: Urology

## 2023-08-07 VITALS — BP 162/107 | HR 79 | Ht 66.0 in | Wt 278.2 lb

## 2023-08-07 DIAGNOSIS — Z125 Encounter for screening for malignant neoplasm of prostate: Secondary | ICD-10-CM | POA: Diagnosis not present

## 2023-08-07 DIAGNOSIS — E349 Endocrine disorder, unspecified: Secondary | ICD-10-CM

## 2023-08-07 NOTE — Progress Notes (Signed)
 I,Daniel Kelley,acting as a scribe for Dustin Gimenez, MD.,have documented all relevant documentation on the behalf of Dustin Gimenez, MD,as directed by  Dustin Gimenez, MD while in the presence of Dustin Gimenez, MD.  08/07/2023 8:32 PM   Lovella Rubin Martina Sledge Oct 11, 1985 [id number]  Referring provider: Dorothe Gaster, NP [address] [place],  [place] [address]  Chief Complaint  Patient presents with   Establish Care   low testosterone     HPI: 38 year-old male presents today for further evaluation of hypogonadism.   He reports symptoms including decreased libido, lack of energy, decreased strength, erectile dysfunction, and fatigue. He has a history of using Viagra  as needed for erectile dysfunction. He was previously on testosterone  therapy, including injections and more recently creams, but has not used any testosterone  therapy in the past year. He has not had his testosterone  levels tested since October 2023.   His medical history includes morbid obesity with a BMI of 44.9, prediabetes, dyslipidemia, and hypertension. 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   He mentions he was never on injections, just the gels, which he didn't get any results. He used the gels a couple years and said his testosterone  levels never went up.   SHIM     Row Name 08/07/23 0959         SHIM: Over the last 6 months:   How do you rate your confidence that you could get and keep an erection? Low     When you had erections with sexual stimulation, how often were your erections hard enough for penetration (entering your partner)? Most Times (much more than half the time)     During sexual intercourse, how often were you able to maintain your erection after you had penetrated (entered) your partner? Sometimes (about half the time)     During sexual intercourse, how difficult was it to maintain your erection to completion of intercourse? Slightly Difficult     When  you attempted sexual intercourse, how often was it satisfactory for you? Sometimes (about half the time)       SHIM Total Score   SHIM 16              PMH: Past Medical History:  Diagnosis Date   Hyperlipidemia    Hypertension    Low testosterone     Mixed dyslipidemia 10/15/2019   Tonsillitis     Surgical History: Past Surgical History:  Procedure Laterality Date   FRACTURE SURGERY  12/2017   right hand    I & D EXTREMITY Right 12/08/2017   Procedure: IRRIGATION AND DEBRIDEMENT EXTREMITY, RIGHT HAND;  Surgeon: Ronn Cohn, MD;  Location: [facility] OR;  Service: Orthopedics;  Laterality: Right;   NO PAST SURGERIES     TONSILLECTOMY N/A 10/01/2015   Procedure: TONSILLECTOMY;  Surgeon: Prescott Brodie, MD;  Location: [facility];  Service: ENT;  Laterality: N/A;    Home Medications:  Allergies as of 08/07/2023       Reactions   Shrimp [shellfish Allergy] Hives, Itching, Swelling, Rash   Not anaphalaxis   Amoxicillin     Other Reaction(s): Not available   Penicillins    Shrimp Extract         Medication List        Accurate as of August 07, 2023  8:32 PM. If you have any questions, ask your nurse or doctor.  STOP taking these medications    sildenafil  100 MG tablet Commonly known as: Viagra    Testosterone  12.5 MG/ACT (1%) Gel       TAKE these medications    hydrochlorothiazide  25 MG tablet Commonly known as: HYDRODIURIL  Take 1 tablet (25 mg total) by mouth daily.   losartan  50 MG tablet Commonly known as: COZAAR  Take 1 tablet (50 mg total) by mouth daily.   rosuvastatin  20 MG tablet Commonly known as: CRESTOR  Take 1 tablet (20 mg total) by mouth daily.        Allergies:  Allergies  Allergen Reactions   Shrimp [Shellfish Allergy] Hives, Itching, Swelling and Rash    Not anaphalaxis   Amoxicillin      Other Reaction(s): Not available   Penicillins    Shrimp Extract     Family History: Family History  Problem  Relation Age of Onset   Kidney disease Mother    Cancer Mother        breast   Hypertension Mother    Hypertension Father     Social History:  reports that he has never smoked. He has never used smokeless tobacco. He reports that he does not currently use alcohol after a past usage of about 4.0 standard drinks of alcohol per week. He reports that he does not use drugs.   Physical Exam: BP (!) 162/107   Pulse 79   Ht 5\' 6"  (1.676 m)   Wt 278 lb 4 oz (126.2 kg)   BMI 44.91 kg/m   Constitutional:  Alert and oriented, No acute distress. HEENT: [place] AT, moist mucus membranes.  Trachea midline, no masses. Neurologic: Grossly intact, no focal deficits, moving all 4 extremities. Psychiatric: Normal mood and affect.   Assessment & Plan:    1. Hypogonadism - Symptoms consistent with hypogonadism. Previous testosterone  therapy with gels was not effective, and there was no follow-up testing to confirm therapeutic levels.   - The plan is to conduct a comprehensive lab workup to assess testosterone  levels, estradiol, pituitary function, and prostate health. Labs to be drawn before 9 am include testosterone , LFT, CBC, PSA, prolactin, LH, and FSH. He will return for a follow-up visit to discuss lab results and treatment options.   - Clomid is recommended as a treatment option to stimulate endogenous testosterone  production, with a follow-up in three months to assess efficacy and adjust treatment as needed. -Alternatives such as injections also discussed, prefers above after discussing risk and benefits  2. Erectile Dysfunction - Likely related to low testosterone  levels and metabolic syndrome. He currently uses Viagra  as needed.   - The treatment plan includes addressing hypogonadism, which may improve erectile function. Lifestyle modifications and weight loss are also recommended to address underlying metabolic syndrome.  Return in about 3 months (around 11/07/2023) for lab results.  I have  reviewed the above documentation for accuracy and completeness, and I agree with the above.   I have reviewed the above documentation for accuracy and completeness, and I agree with the above.   Dustin Gimenez, MD   Dustin Gimenez, MD   [facility] [address] [place], [place] [address] [phone]

## 2023-08-08 ENCOUNTER — Other Ambulatory Visit

## 2023-08-08 DIAGNOSIS — Z125 Encounter for screening for malignant neoplasm of prostate: Secondary | ICD-10-CM

## 2023-08-08 DIAGNOSIS — E349 Endocrine disorder, unspecified: Secondary | ICD-10-CM

## 2023-08-09 ENCOUNTER — Ambulatory Visit: Payer: Self-pay | Admitting: Urology

## 2023-08-09 LAB — HEPATIC FUNCTION PANEL
ALT: 25 IU/L (ref 0–44)
AST: 30 IU/L (ref 0–40)
Albumin: 4.8 g/dL (ref 4.1–5.1)
Alkaline Phosphatase: 73 IU/L (ref 44–121)
Bilirubin Total: 0.3 mg/dL (ref 0.0–1.2)
Bilirubin, Direct: 0.13 mg/dL (ref 0.00–0.40)
Total Protein: 7.3 g/dL (ref 6.0–8.5)

## 2023-08-09 LAB — PSA: Prostate Specific Ag, Serum: 0.9 ng/mL (ref 0.0–4.0)

## 2023-08-09 LAB — CBC
Hematocrit: 40.6 % (ref 37.5–51.0)
Hemoglobin: 13.2 g/dL (ref 13.0–17.7)
MCH: 29.2 pg (ref 26.6–33.0)
MCHC: 32.5 g/dL (ref 31.5–35.7)
MCV: 90 fL (ref 79–97)
Platelets: 295 10*3/uL (ref 150–450)
RBC: 4.52 x10E6/uL (ref 4.14–5.80)
RDW: 14.6 % (ref 11.6–15.4)
WBC: 6.5 10*3/uL (ref 3.4–10.8)

## 2023-08-09 LAB — PROLACTIN: Prolactin: 18 ng/mL (ref 3.9–22.7)

## 2023-08-09 LAB — TESTOSTERONE: Testosterone: 117 ng/dL — ABNORMAL LOW (ref 264–916)

## 2023-08-09 LAB — FSH/LH
FSH: 1.7 m[IU]/mL (ref 1.5–12.4)
LH: 1.8 m[IU]/mL (ref 1.7–8.6)

## 2023-08-09 MED ORDER — CLOMIPHENE CITRATE 50 MG PO TABS: 25.0000 mg | ORAL_TABLET | Freq: Every day | ORAL | 2 refills | Status: DC

## 2023-08-16 ENCOUNTER — Ambulatory Visit: Admitting: Urology

## 2023-08-29 ENCOUNTER — Ambulatory Visit: Admitting: Nurse Practitioner

## 2023-09-18 ENCOUNTER — Encounter: Payer: Self-pay | Admitting: Urology

## 2023-10-05 ENCOUNTER — Other Ambulatory Visit: Payer: Self-pay

## 2023-10-05 MED ORDER — CLOMIPHENE CITRATE 50 MG PO TABS: 25.0000 mg | ORAL_TABLET | Freq: Every day | ORAL | 0 refills | Status: DC

## 2023-10-10 ENCOUNTER — Encounter: Payer: Self-pay | Admitting: Nurse Practitioner

## 2023-10-10 ENCOUNTER — Telehealth: Payer: Self-pay | Admitting: Urology

## 2023-10-10 NOTE — Telephone Encounter (Signed)
 Patient called and stated that [facility]  is out of Clomid , and he would like it sent to [facility] in [place].

## 2023-10-11 ENCOUNTER — Other Ambulatory Visit: Payer: Self-pay

## 2023-10-11 MED ORDER — CLOMIPHENE CITRATE 50 MG PO TABS: 25.0000 mg | ORAL_TABLET | Freq: Every day | ORAL | 0 refills | Status: DC

## 2023-10-11 NOTE — Telephone Encounter (Signed)
 Clomid  sent into [facility]

## 2023-10-25 ENCOUNTER — Other Ambulatory Visit (HOSPITAL_COMMUNITY): Payer: Self-pay

## 2023-10-25 ENCOUNTER — Telehealth: Payer: Self-pay

## 2023-10-25 NOTE — Telephone Encounter (Signed)
 Pharmacy Patient Advocate Encounter   Received notification from Physician's Office that prior authorization for Clomid  50MG  tablets is required/requested.   Insurance verification completed.   The patient is insured through [facility] .   Per test claim: PA required; PA submitted to above mentioned insurance via Latent Key/confirmation #/[id number] Status is pending

## 2023-10-26 NOTE — Telephone Encounter (Signed)
 Pharmacy Patient Advocate Encounter  Received notification from [employer] that Prior Authorization for Clomid  50MG  tablets  has been DENIED.  Full denial letter will be uploaded to the media tab. See denial reason below.    PA #/Case ID/Reference #: [id number]

## 2023-11-07 ENCOUNTER — Other Ambulatory Visit

## 2023-11-07 DIAGNOSIS — E349 Endocrine disorder, unspecified: Secondary | ICD-10-CM | POA: Diagnosis not present

## 2023-11-08 ENCOUNTER — Ambulatory Visit: Admitting: Nurse Practitioner

## 2023-11-08 ENCOUNTER — Other Ambulatory Visit: Payer: Self-pay | Admitting: Urology

## 2023-11-08 VITALS — BP 134/86 | HR 88 | Temp 98.6°F | Ht 66.0 in | Wt 276.0 lb

## 2023-11-08 DIAGNOSIS — I10 Essential (primary) hypertension: Secondary | ICD-10-CM | POA: Diagnosis not present

## 2023-11-08 DIAGNOSIS — E782 Mixed hyperlipidemia: Secondary | ICD-10-CM | POA: Diagnosis not present

## 2023-11-08 LAB — HEPATIC FUNCTION PANEL
ALT: 24 IU/L (ref 0–44)
AST: 25 IU/L (ref 0–40)
Albumin: 4.4 g/dL (ref 4.1–5.1)
Alkaline Phosphatase: 64 IU/L (ref 44–121)
Bilirubin Total: <0.2 mg/dL (ref 0.0–1.2)
Bilirubin, Direct: <0.08 mg/dL (ref 0.00–0.40)
Total Protein: 7.1 g/dL (ref 6.0–8.5)

## 2023-11-08 LAB — TESTOSTERONE: Testosterone: 481 ng/dL (ref 264–916)

## 2023-11-08 MED ORDER — ZEPBOUND 2.5 MG/0.5ML ~~LOC~~ SOAJ: 2.5000 mg | SUBCUTANEOUS | 0 refills | Status: AC

## 2023-11-08 MED ORDER — ATORVASTATIN CALCIUM 40 MG PO TABS
40.0000 mg | ORAL_TABLET | Freq: Every day | ORAL | 3 refills | Status: AC
Start: 2023-11-08 — End: ?

## 2023-11-08 NOTE — Progress Notes (Signed)
 Established Patient Office Visit  Subjective   Patient ID: Daniel Kelley, male    DOB: 15-Dec-1985  Age: 38 y.o. MRN: [id number]  Chief Complaint  Patient presents with   Medication Refill    Zepbound     Medication Management    Pt complains that Crestor  is making him drowsy when taken in the morning or night. 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     Medication Refill Pertinent negatives include no chest pain, chills or fever.   Discussed the use of AI scribe software for clinical note transcription with the patient, who gave verbal consent to proceed.  History of Present Illness Daniel Kelley is a 38 year old male who presents for evaluation and management of weight loss treatment.  He is interested in starting treatment with Zepbound , a weight loss injectable drug, after resolving insurance coverage issues. He has not yet tried the medication. He currently eats three meals a day and has reduced his portion sizes, particularly at dinner, contributing to a weight loss of four pounds. This recent weight loss is also attributed to having four wisdom teeth and a molar extracted last week, leading to a diet of liquids and soft foods.  He reports low motivation for exercise, which he attributes to previously low testosterone  levels. Recent blood work indicates improving testosterone  levels, and he is experiencing increased energy. 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  He is currently taking rosuvastatin  for cholesterol management but reports significant drowsiness, even when taken at night. He has previously tried fenofibrate  but has not used atorvastatin .   In terms of social history, he drinks a lot of water and coffee throughout the day and occasionally treats himself to a soda. He denies any family history of medullary thyroid  cancer or multiple endocrine neoplasia syndrome type two.  No fever, chills, chest pain, or shortness of breath. He reports  regular daily bowel movements      Review of Systems  Constitutional:  Positive for malaise/fatigue. Negative for chills and fever.  Respiratory:  Negative for shortness of breath.   Cardiovascular:  Negative for chest pain.  Gastrointestinal:        BM daily       Objective:     BP 134/86   Pulse 88   Temp 98.6 F (37 C) (Oral)   Ht 5' 6 (1.676 m)   Wt 276 lb (125.2 kg)   SpO2 98%   BMI 44.55 kg/m  BP Readings from Last 3 Encounters:  11/08/23 134/86  08/07/23 (!) 162/107  05/29/23 (!) 132/100   Wt Readings from Last 3 Encounters:  11/08/23 276 lb (125.2 kg)  08/07/23 278 lb 4 oz (126.2 kg)  05/29/23 280 lb 12.8 oz (127.4 kg)   SpO2 Readings from Last 3 Encounters:  11/08/23 98%  05/29/23 95%  11/16/22 97%      Physical Exam Vitals and nursing note reviewed.  Constitutional:      Appearance: Normal appearance.  Cardiovascular:     Rate and Rhythm: Normal rate and regular rhythm.     Heart sounds: Normal heart sounds.  Pulmonary:     Effort: Pulmonary effort is normal.     Breath sounds: Normal breath sounds.  Abdominal:     General: Bowel sounds are normal.  Neurological:     Mental Status: He is alert.      No results found for any visits on 11/08/23.    The ASCVD Risk  score (Arnett DK, et al., 2019) failed to calculate for the following reasons:   The 2019 ASCVD risk score is only valid for ages 47 to 78    Assessment & Plan:   Problem List Items Addressed This Visit       Cardiovascular and Mediastinum   Essential hypertension - Primary   Relevant Medications   atorvastatin  (LIPITOR) 40 MG tablet   tirzepatide  (ZEPBOUND ) 2.5 MG/0.5ML Pen     Other   Mixed dyslipidemia   Relevant Medications   atorvastatin  (LIPITOR) 40 MG tablet   tirzepatide  (ZEPBOUND ) 2.5 MG/0.5ML Pen   Morbid obesity (HCC)   Relevant Medications   tirzepatide  (ZEPBOUND ) 2.5 MG/0.5ML Pen   Assessment and Plan Assessment & Plan Morbid obesity Morbid  obesity with recent weight loss, likely due to dental extractions and liquid diet. Insurance covers Zepbound , a weight loss injectable. No family history of medullary thyroid  cancer or multiple endocrine neoplasia syndrome type two. Explained Zepbound  dosing and side effects. - Initiate Zepbound  2.5 mg weekly, titrate to 5 mg in month two, 7.5 mg in month three, continue up to 15 mg as needed. - Schedule follow-up every three months for progress monitoring and insurance. - Encourage dietary modifications and 30 minutes of exercise five times weekly. - Advise on Zepbound  side effects, ensure hydration and fiber intake. - Discuss slow eating to prevent overeating.  Mixed dyslipidemia Mixed dyslipidemia with drowsiness from rosuvastatin . Decision to switch to atorvastatin  due to side effects. - Discontinue rosuvastatin . - Initiate atorvastatin  40 mg daily, monitor efficacy and side effects.  Return in about 3 months (around 02/07/2024) for Weight recheck .    Adina Crandall, NP

## 2023-11-08 NOTE — Progress Notes (Signed)
 11/09/2023 7:41 AM   Daniel Kelley 1986/01/18 [id number]  Referring provider: Wendee Lynwood HERO, NP [address] [place],  [place] [address]  Urological history: 1. ED  2. Hypogonadism - Testosterone  level (10/2023) 481 - AST/ALT (10/2023) 25/24 - Clomid  50 mg, 1/2 tablet daily  Chief Complaint  Patient presents with   Follow-up   Hypogonadism   HPI: Daniel Kelley is a 37 y.o. man who presents today for three month follow up.    Previous records reviewed.  He reports good adherence to Clomid  50 mg, 1/2 tablet daily.  Denies new complaints of low libido, erectile dysfunction, fatigue, or mood changes.  No complaints of gynecomastia, visual changes, or thromboembolic symptoms.  Energy level, libido and overall sense of wellbeing being reported as stable/ improved compared to prior visit.     He has no urinary issues.  Patient denies any modifying or aggravating factors.  Patient denies any recent UTI's, gross hematuria, dysuria or suprapubic/flank pain.  Patient denies any fevers, chills, nausea or vomiting.    He is having issues with ED.   He has a difficult time maintaining.  Patient still having spontaneous erections.  He denies any pain or curvature with erections.   He sexual activity is spontaneous vs planned.    PMH: Past Medical History:  Diagnosis Date   Hyperlipidemia    Hypertension    Low testosterone     Mixed dyslipidemia 10/15/2019   Tonsillitis     Surgical History: Past Surgical History:  Procedure Laterality Date   FRACTURE SURGERY  12/2017   right hand    I & D EXTREMITY Right 12/08/2017   Procedure: IRRIGATION AND DEBRIDEMENT EXTREMITY, RIGHT HAND;  Surgeon: Camella Fallow, MD;  Location: [facility] OR;  Service: Orthopedics;  Laterality: Right;   NO PAST SURGERIES     TONSILLECTOMY N/A 10/01/2015   Procedure: TONSILLECTOMY;  Surgeon: Lonni FORBES Angle, MD;  Location: [facility];  Service: ENT;  Laterality: N/A;    Home Medications:   Allergies as of 11/09/2023       Reactions   Shrimp [shellfish Allergy] Hives, Itching, Swelling, Rash   Not anaphalaxis   Amoxicillin     Other Reaction(s): Not available   Penicillins    Shrimp Extract         Medication List        Accurate as of November 09, 2023 11:59 PM. If you have any questions, ask your nurse or doctor.          atorvastatin  40 MG tablet Commonly known as: LIPITOR Take 1 tablet (40 mg total) by mouth daily.   chlorhexidine 0.12 % solution Commonly known as: PERIDEX RINSE MOUTH WITH (1 CAPFUL) FOR 30 SECONDS IN MORNING AND EVENING AFTER BRUSHING, THEN SPIT   Clomid  50 MG tablet Generic drug: clomiPHENE  TAKE 1/2 TABLET BY MOUTH DAILY What changed: Another medication with the same name was added. Make sure you understand how and when to take each. Changed by: Carolanne Mercier   clomiPHENE  50 MG tablet Commonly known as: CLOMID  Take 1/2 tablet daily What changed: You were already taking a medication with the same name, and this prescription was added. Make sure you understand how and when to take each. Changed by: CLOTILDA CORNWALL   hydrochlorothiazide  25 MG tablet Commonly known as: HYDRODIURIL  Take 1 tablet (25 mg total) by mouth daily.   ibuprofen  800 MG tablet Commonly known as: ADVIL  Take 800 mg by mouth every 6 (six) hours  as needed.   losartan  50 MG tablet Commonly known as: COZAAR  Take 1 tablet (50 mg total) by mouth daily.   tadalafil  5 MG tablet Commonly known as: CIALIS  Take 1 tablet (5 mg total) by mouth daily. Started by: Kashawna Manzer   Zepbound  2.5 MG/0.5ML Pen Generic drug: tirzepatide  Inject 2.5 mg into the skin once a week.        Allergies:  Allergies  Allergen Reactions   Shrimp [Shellfish Allergy] Hives, Itching, Swelling and Rash    Not anaphalaxis   Amoxicillin      Other Reaction(s): Not available   Penicillins    Shrimp Extract     Family History: Family History  Problem Relation Age of  Onset   Kidney disease Mother    Cancer Mother        breast   Hypertension Mother    Hypertension Father     Social History:  reports that he has never smoked. He has never used smokeless tobacco. He reports that he does not currently use alcohol after a past usage of about 4.0 standard drinks of alcohol per week. He reports that he does not use drugs.  ROS: Pertinent ROS in HPI  Physical Exam: BP (!) 156/90 (BP Location: Left Arm, Patient Position: Sitting, Cuff Size: Large)   Pulse 86   Wt 270 lb (122.5 kg)   SpO2 98%   BMI 43.58 kg/m   Constitutional:  Well nourished. Alert and oriented, No acute distress. HEENT: [place] AT, moist mucus membranes.  Trachea midline Cardiovascular: No clubbing, cyanosis, or edema. Respiratory: Normal respiratory effort, no increased work of breathing. Neurologic: Grossly intact, no focal deficits, moving all 4 extremities. Psychiatric: Normal mood and affect.  Laboratory Data: Lab Results  Component Value Date   PSA 0.91 02/10/2022    Lab Results  Component Value Date   WBC 6.5 08/08/2023   HGB 13.2 08/08/2023   HCT 40.6 08/08/2023   MCV 90 08/08/2023   PLT 295 08/08/2023    Lab Results  Component Value Date   CREATININE 0.96 05/29/2023    Lab Results  Component Value Date   TESTOSTERONE  481 11/07/2023    Lab Results  Component Value Date   HGBA1C 6.4 05/29/2023  I have reviewed the labs.   Pertinent Imaging: N/A  Assessment & Plan:    1. Hypogonadism  -testosterone  levels are therapeutic -ASTALT normal -continue Clomid  50 mg, 1/2 tablet daily -Reinforced counseling regarding potential side effects  (visual disturbances, mood changes and thromboembolic risks)  2. Erectile dysfunction:    - I explained that conditions like diabetes, hypertension, coronary artery disease, peripheral vascular disease, smoking, alcohol consumption, age, sleep apnea and BPH can diminish the ability to have an erection - A recent study  published in Sex Med 2018 Apr 13 revealed moderate to vigorous aerobic exercise for 40 minutes 4 times per week can decrease erectile problems caused by physical inactivity, obesity, hypertension, metabolic syndrome and/or cardiovascular diseases  - We discussed trying a PDE5 inhibitor and we dicussed the various types and doses and I recommended tadalafil  5 mg daily as I felt this will work better with his desire to have sex spontaneously vs a planned date night and he is in agreement - tadalafil  5 mg daily send to pharmacy, advised not to take with nitroglycerine products      Return in about 6 months (around 05/08/2024) for AM testosterone , hepatic panel, hemoglobin/hematocrit only .  And then one year for am testosterone , hepatic panel,  hemoglobin/hematocrit and office visit   These notes generated with voice recognition software. I apologize for typographical errors.  CLOTILDA HELON RIGGERS  [facility] [address]  [address] [place], [place] [address] [phone]

## 2023-11-09 ENCOUNTER — Ambulatory Visit: Admitting: Urology

## 2023-11-09 VITALS — BP 156/90 | HR 86 | Wt 270.0 lb

## 2023-11-09 DIAGNOSIS — N529 Male erectile dysfunction, unspecified: Secondary | ICD-10-CM | POA: Diagnosis not present

## 2023-11-09 DIAGNOSIS — E291 Testicular hypofunction: Secondary | ICD-10-CM

## 2023-11-09 MED ORDER — CLOMIPHENE CITRATE 50 MG PO TABS
ORAL_TABLET | ORAL | 3 refills | Status: AC
Start: 2023-11-09 — End: ?

## 2023-11-09 MED ORDER — TADALAFIL 5 MG PO TABS
5.0000 mg | ORAL_TABLET | Freq: Every day | ORAL | 11 refills | Status: AC
Start: 2023-11-09 — End: ?

## 2023-11-11 ENCOUNTER — Encounter: Payer: Self-pay | Admitting: Urology

## 2023-12-05 ENCOUNTER — Other Ambulatory Visit: Payer: Self-pay | Admitting: Nurse Practitioner

## 2023-12-05 DIAGNOSIS — I10 Essential (primary) hypertension: Secondary | ICD-10-CM

## 2023-12-11 ENCOUNTER — Other Ambulatory Visit (HOSPITAL_COMMUNITY): Payer: Self-pay

## 2023-12-11 ENCOUNTER — Telehealth: Payer: Self-pay

## 2023-12-11 NOTE — Telephone Encounter (Signed)
 Pharmacy Patient Advocate Encounter   Received notification from Onbase that prior authorization for Zepbound  2.5 is required/requested.   Insurance verification completed.   The patient is insured through [facility].   Per test claim: PA required; PA submitted to above mentioned insurance via Latent Key/confirmation #/EOC [facility] Status is pending

## 2023-12-12 ENCOUNTER — Other Ambulatory Visit (HOSPITAL_COMMUNITY): Payer: Self-pay

## 2023-12-12 ENCOUNTER — Telehealth: Payer: Self-pay

## 2023-12-12 NOTE — Telephone Encounter (Signed)
 Started Prior authorization on cover my meds

## 2023-12-12 NOTE — Telephone Encounter (Signed)
 Pharmacy Patient Advocate Encounter  Received notification from [employer] that Prior Authorization for Zepbound  2.5 has been DENIED.  Full denial letter will be uploaded to the media tab. See denial reason below.     PA #/Case ID/Reference #: # [id number]

## 2024-01-02 ENCOUNTER — Telehealth: Payer: Self-pay

## 2024-01-02 ENCOUNTER — Other Ambulatory Visit (HOSPITAL_COMMUNITY): Payer: Self-pay

## 2024-01-02 NOTE — Telephone Encounter (Signed)
 Ozempic/Mounjaro is approved exclusively as an adjunct to diet and exercise to improve glycemic  control in adults with type 2 diabetes mellitus. A review of patient's medical chart reveals no  documented diagnosis of type 2 diabetes or an A1C indicative of diabetes. Therefore, they do not  currently meet the criteria for prior authorization of this medication. If clinically appropriate, alternative  options such as Saxenda, Zepbound , or Wegovy  may be considered for this patient. Once this information is received, PA request can be submitted. Per previous denial, patient will need an addendum outlying the following:

## 2024-01-02 NOTE — Telephone Encounter (Signed)
 Not sure what the message is about. I did not order ozempic nor mounjaro.

## 2024-01-10 ENCOUNTER — Encounter: Payer: Self-pay | Admitting: Nurse Practitioner

## 2024-02-08 ENCOUNTER — Ambulatory Visit: Admitting: Nurse Practitioner

## 2024-02-08 VITALS — BP 126/82 | HR 81 | Temp 98.0°F | Ht 66.0 in | Wt 271.8 lb

## 2024-02-08 DIAGNOSIS — E349 Endocrine disorder, unspecified: Secondary | ICD-10-CM

## 2024-02-08 DIAGNOSIS — I10 Essential (primary) hypertension: Secondary | ICD-10-CM

## 2024-02-08 NOTE — Progress Notes (Signed)
 Established Patient Office Visit  Subjective   Patient ID: Daniel Kelley, male    DOB: [birth year]  Age: 38 y.o. MRN: [id number]  Chief Complaint  Patient presents with   weight recheck    Pt complains of wanting to discuss alternative methods  for weight loss.     HPI  Discussed the use of AI scribe software for clinical note transcription with the patient, who gave verbal consent to proceed.  History of Present Illness Daniel Kelley is a 38 year old male who presents for follow-up on weight management and medication coverage issues.   He is currently seeking coverage for a weight loss injectable, Zepbound , which his insurance has not approved yet. His current weight is 271 pounds, down from his heaviest at 280 pounds.  He is mindful of his dietary habits, consuming two meals a day without snacking, and primarily drinks water with an occasional full-flavor [facility] in the morning. He has reduced his portion sizes and no longer uses white salt in his meals. His physical activity is limited to yard work, and he acknowledges the need to increase exercise.  He is a naval architect, which involves prolonged sitting. He is also under urology care for Clomid , with a follow-up scheduled for early next year.  No fever, chills, chest pain, or shortness of breath. Normal urinary and bowel functions.     Review of Systems  Constitutional:  Negative for chills and fever.  Respiratory:  Negative for shortness of breath.   Cardiovascular:  Negative for chest pain.  Gastrointestinal:  Negative for abdominal pain.      Objective:     BP 126/82   Pulse 81   Temp 98 F (36.7 C) (Oral)   Ht 5' 6 (1.676 m)   Wt 271 lb 12.8 oz (123.3 kg)   SpO2 94%   BMI 43.87 kg/m  BP Readings from Last 3 Encounters:  02/08/24 126/82  11/09/23 (!) 156/90  11/08/23 134/86   Wt Readings from Last 3 Encounters:  02/08/24 271 lb 12.8 oz (123.3 kg)  11/09/23 270 lb (122.5 kg)  11/08/23 276 lb (125.2  kg)   SpO2 Readings from Last 3 Encounters:  02/08/24 94%  11/09/23 98%  11/08/23 98%      Physical Exam Vitals and nursing note reviewed.  Constitutional:      Appearance: Normal appearance.  Cardiovascular:     Rate and Rhythm: Normal rate and regular rhythm.     Heart sounds: Normal heart sounds.  Pulmonary:     Effort: Pulmonary effort is normal.     Breath sounds: Normal breath sounds.  Abdominal:     General: Bowel sounds are normal.  Neurological:     Mental Status: He is alert.      No results found for any visits on 02/08/24.    The ASCVD Risk score (Arnett DK, et al., 2019) failed to calculate for the following reasons:   The 2019 ASCVD risk score is only valid for ages 28 to 30    Assessment & Plan:   Problem List Items Addressed This Visit       Cardiovascular and Mediastinum   Essential hypertension - Primary     Other   Morbid obesity (HCC)   Testosterone  deficiency   Assessment and Plan Assessment & Plan Morbid obesity Weight decreased from 280 lbs to 271 lbs. Previous weight loss achieved without medication. Insurance requires comprehensive program for Zepbound  coverage. Current diet includes two meals daily, primarily water,  occasional [facility]. Limited exercise. - [facility]erred to [facility] for comprehensive weight management. - Encouraged gradual increase in physical activity, starting with walking 10-15 minutes, 1-2 times a week, increasing to 30 minutes, 5 times a week. - Recommended using My Fitness Pal or Lose It app to track caloric intake. - Advised on reducing salt intake, especially from prepared meals and fast food.  Essential hypertension Blood pressure satisfactory. Potential for medication reduction with significant weight loss. - Continue current blood pressure management. - Encouraged weight loss to potentially reduce or eliminate antihypertensive medication.  Return in about 4 months (around  06/08/2024) for CPE and Labs.    Adina Crandall, NP

## 2024-02-08 NOTE — Patient Instructions (Signed)
 Nice to see you today  The free apps to help track your food are My fitness Pal and  Lose It  [facility]  Address: [address] [place], [place], [place] [address] Hours:  Closes soon ? 5?PM ? Opens 7?AM Tue Confirmed by this business 7 weeks ago Phone: [phone]  Follow up with me in 4 months for your physical and labs

## 2024-05-02 ENCOUNTER — Other Ambulatory Visit

## 2024-05-09 ENCOUNTER — Ambulatory Visit: Admitting: Urology

## 2024-06-13 ENCOUNTER — Encounter: Admitting: Nurse Practitioner
